# Patient Record
Sex: Female | Born: 1990 | Hispanic: Yes | Smoking: Never smoker
Health system: Southern US, Community
[De-identification: ages and names within clinical notes are randomized; demographics above are authoritative.]

## PROBLEM LIST (undated history)

## (undated) ENCOUNTER — Inpatient Hospital Stay (HOSPITAL_COMMUNITY): Payer: Self-pay

## (undated) DIAGNOSIS — Z789 Other specified health status: Secondary | ICD-10-CM

## (undated) DIAGNOSIS — L309 Dermatitis, unspecified: Secondary | ICD-10-CM

## (undated) HISTORY — DX: Dermatitis, unspecified: L30.9

## (undated) HISTORY — PX: NO PAST SURGERIES: SHX2092

## (undated) HISTORY — PX: LASIK: SHX215

---
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
## 2012-07-28 ENCOUNTER — Encounter (HOSPITAL_COMMUNITY): Payer: Self-pay | Admitting: *Deleted

## 2012-07-28 ENCOUNTER — Emergency Department (INDEPENDENT_AMBULATORY_CARE_PROVIDER_SITE_OTHER)
Admission: EM | Admit: 2012-07-28 | Discharge: 2012-07-28 | Disposition: A | Discharge status: Home / Self Care | Payer: Self-pay | Source: Home / Self Care | Attending: Family Medicine | Admitting: Family Medicine

## 2012-07-28 DIAGNOSIS — N390 Urinary tract infection, site not specified: Secondary | ICD-10-CM

## 2012-07-28 DIAGNOSIS — K5900 Constipation, unspecified: Secondary | ICD-10-CM

## 2012-07-28 DIAGNOSIS — R55 Syncope and collapse: Secondary | ICD-10-CM

## 2012-07-28 LAB — POCT URINALYSIS DIP (DEVICE)
Bilirubin Urine: NEGATIVE
Ketones, ur: NEGATIVE mg/dL
Protein, ur: 30 mg/dL — AB
Specific Gravity, Urine: 1.025 (ref 1.005–1.030)

## 2012-07-28 LAB — POCT I-STAT, CHEM 8
BUN: 13 mg/dL (ref 6–23)
Calcium, Ion: 1.33 mmol/L — ABNORMAL HIGH (ref 1.12–1.23)
Chloride: 105 mEq/L (ref 96–112)
Potassium: 4.2 mEq/L (ref 3.5–5.1)

## 2012-07-28 LAB — POCT PREGNANCY, URINE: Preg Test, Ur: NEGATIVE

## 2012-07-28 MED ORDER — POLYETHYLENE GLYCOL 3350 17 G PO PACK: 17.0000 g | PACK | Freq: Every day | ORAL | Status: DC

## 2012-07-28 MED ORDER — HYDROCORTISONE ACE-PRAMOXINE 1-1 % RE CREA: TOPICAL_CREAM | Freq: Two times a day (BID) | RECTAL | Status: DC

## 2012-07-28 MED ORDER — SULFAMETHOXAZOLE-TRIMETHOPRIM 800-160 MG PO TABS: 1.0000 | ORAL_TABLET | Freq: Two times a day (BID) | ORAL | Status: AC

## 2012-07-28 NOTE — ED Notes (Signed)
PT  REPORTS SHE  BECAME    DIZZY      THIS  AM  AND   BRIEFLY  PASSED  OUT    SHE  REPORTS  SHE  HAS  DONE THIS  IN  PAST  SHE REPORTS  SHE  HAD  SOME  SLIHGT  NAUSEA       -  SHE  REPORTS  SHE  FEELS  BETTER  NOW  AND   SHE    IS  AWAKE  ALERT  AND  ORIENTED  -  SHE  REPORTS  SHE       DID  HOWEVER  EAT THIS  AM

## 2012-07-29 NOTE — ED Provider Notes (Signed)
History     CSN: [id number]  Arrival date & time 07/28/12  1139   First MD Initiated Contact with Patient 07/28/12 1208      Chief Complaint  Patient presents with  . Dizziness    (Consider location/radiation/quality/duration/timing/severity/associated sxs/prior treatment) HPI Comments: 21 year old smoking female with no significant past medical history. Here complaining of episodes of dizziness this morning at work. Symptoms was associated with mild nausea but no vomiting. Symptoms were brief and self resolved. Patient denies headache or visual changes. No seizures, syncope, palpitations or periods where she loses consciousness. Patient reports intermittent dizzy episodes when she gets up abruptly from bed or chair in the past. Also patient has been told in the past she has low blood pressure. Reports urinary frequency and discomfort on urination during the last 2 days. Denies abdominal, pelvic pain or vaginal discharge.  Patient also reports constipation and rectal irritation after defecation. Last bowel movement 2 days ago hard stools. Patient reports he had breakfast but she barely drinks fluids.   History reviewed. No pertinent past medical history.  History reviewed. No pertinent past surgical history.  No family history on file.  History  Substance Use Topics  . Smoking status: Never Smoker   . Smokeless tobacco: Not on file  . Alcohol Use: No    OB History    Grav Para Term Preterm Abortions TAB SAB Ect Mult Living                  Review of Systems  Constitutional: Negative for fever, chills, diaphoresis, activity change, appetite change, fatigue and unexpected weight change.  HENT: Negative for ear pain, congestion, sore throat, neck pain, tinnitus and ear discharge.   Eyes: Negative for visual disturbance.  Respiratory: Negative for cough and shortness of breath.   Cardiovascular: Negative for chest pain, palpitations and leg swelling.  Gastrointestinal:  Positive for constipation and rectal pain. Negative for nausea, vomiting, abdominal pain and diarrhea.  Genitourinary: Positive for dysuria and frequency. Negative for urgency, hematuria, flank pain, vaginal bleeding, vaginal discharge, vaginal pain and pelvic pain.  Neurological: Positive for dizziness. Negative for tremors, seizures, syncope, weakness, numbness and headaches.    Allergies  Review of patient's allergies indicates no known allergies.  Home Medications   Current Outpatient Rx  Name  Route  Sig  Dispense  Refill  . POLYETHYLENE GLYCOL 3350 PO PACK   Oral   Take 17 g by mouth daily.   14 each   0   . HYDROCORTISONE ACE-PRAMOXINE 1-1 % RE CREA   Rectal   Place rectally 2 (two) times daily.   30 g   0   . SULFAMETHOXAZOLE-TRIMETHOPRIM 800-160 MG PO TABS   Oral   Take 1 tablet by mouth 2 (two) times daily.   6 tablet   0     BP 95/62  Pulse 64  Temp 97.9 F (36.6 C) (Oral)  Resp 20  SpO2 99%  Physical Exam  Nursing note and vitals reviewed. Constitutional: She is oriented to person, place, and time. She appears well-developed and well-nourished. No distress.  HENT:  Head: Normocephalic and atraumatic.  Right Ear: External ear normal.  Left Ear: External ear normal.  Nose: Nose normal.  Mouth/Throat: Oropharynx is clear and moist. No oropharyngeal exudate.  Eyes: Conjunctivae normal and EOM are normal. Pupils are equal, round, and reactive to light. Right eye exhibits no discharge. Left eye exhibits no discharge. No scleral icterus.  Neck: Normal range of motion. Neck  supple. No JVD present. No thyromegaly present.  Cardiovascular: Normal rate, regular rhythm, normal heart sounds and intact distal pulses.  Exam reveals no gallop and no friction rub.   No murmur heard. Pulmonary/Chest: Effort normal and breath sounds normal. No respiratory distress. She has no wheezes. She has no rales. She exhibits no tenderness.  Abdominal: Soft. Bowel sounds are  normal. She exhibits no distension and no mass. There is no tenderness. There is no rebound and no guarding.       No cvt  Lymphadenopathy:    She has no cervical adenopathy.  Neurological: She is alert and oriented to person, place, and time. She displays normal reflexes. No cranial nerve deficit. She exhibits normal muscle tone. Coordination normal.  Skin: Skin is warm. No rash noted. She is not diaphoretic. No pallor.  Psychiatric: She has a normal mood and affect. Her behavior is normal. Judgment and thought content normal.    ED Course  Procedures (including critical care time)  Labs Reviewed  POCT URINALYSIS DIP (DEVICE) - Abnormal; Notable for the following:    Protein, ur 30 (*)     Leukocytes, UA SMALL (*)  Biochemical Testing Only. Please order routine urinalysis from main lab if confirmatory testing is needed.   All other components within normal limits  POCT I-STAT, CHEM 8 - Abnormal; Notable for the following:    Glucose, Bld 101 (*)     Calcium, Ion 1.33 (*)     All other components within normal limits  POCT PREGNANCY, URINE  LAB REPORT - SCANNED   No results found.   1. UTI (lower urinary tract infection)   2. Vasovagal near syncope   3. Constipation       MDM  Normal physical examination including neurologic exam. Mild orthostatic heart rate changes. Appears patient normally has low blood pressure. Mild findings in urine suggestive of possible low uncomplicated UTI possibly adding to patient's symptoms. Negative pregnancy test. Decided to treat with Bactrim. Encouraged good hydration.  Constipation was treated with MiraLax and pramoxine/hydrocortisone rectal cream for rectal discomfort. Asked to establish care with a primary care provider to monitor her symptoms. Supportive care and red flags that should prompt her return to medical attention discussed with patient, mother and provided in writing.        Sharin Grave, MD 07/30/12 1116

## 2013-08-10 ENCOUNTER — Emergency Department (INDEPENDENT_AMBULATORY_CARE_PROVIDER_SITE_OTHER)
Admission: EM | Admit: 2013-08-10 | Discharge: 2013-08-10 | Disposition: A | Discharge status: Home / Self Care | Payer: Self-pay | Source: Home / Self Care | Attending: Family Medicine | Admitting: Family Medicine

## 2013-08-10 ENCOUNTER — Encounter (HOSPITAL_COMMUNITY): Payer: Self-pay | Admitting: Emergency Medicine

## 2013-08-10 ENCOUNTER — Emergency Department (INDEPENDENT_AMBULATORY_CARE_PROVIDER_SITE_OTHER): Payer: Self-pay

## 2013-08-10 DIAGNOSIS — J069 Acute upper respiratory infection, unspecified: Secondary | ICD-10-CM

## 2013-08-10 MED ORDER — BENZONATATE 100 MG PO CAPS: 100.0000 mg | ORAL_CAPSULE | Freq: Three times a day (TID) | ORAL | Status: DC | PRN

## 2013-08-10 NOTE — ED Notes (Signed)
Cough and sore throat for a week, cough particularly bad at night, no fever

## 2013-08-10 NOTE — ED Provider Notes (Signed)
CSN: [id number]     Arrival date & time 08/10/13  1003 History   First MD Initiated Contact with Patient 08/10/13 1029     Chief Complaint  Patient presents with  . Cough  . Sore Throat   (Consider location/radiation/quality/duration/timing/severity/associated sxs/prior Treatment) HPI Comments: Denies dyspnea, fever, or hemoptysis. No recent travel or ill contacts. Patient is non-smoker. Describes throat as feeling irritated from the cough.   Patient is a 22 y.o. female presenting with cough and pharyngitis. The history is provided by the patient.  Cough Cough characteristics:  Dry Severity:  Mild Onset quality:  Gradual Duration:  1 week Timing:  Intermittent Progression:  Waxing and waning Chronicity:  New Smoker: no   Context: not occupational exposure, not sick contacts and not smoke exposure   Ineffective treatments:  None tried Associated symptoms: sore throat   Sore Throat    History reviewed. No pertinent past medical history. Past Surgical History  Procedure Laterality Date  . Lasik     No family history on file. History  Substance Use Topics  . Smoking status: Never Smoker   . Smokeless tobacco: Not on file  . Alcohol Use: No   OB History   Grav Para Term Preterm Abortions TAB SAB Ect Mult Living                 Review of Systems  HENT: Positive for sore throat.   Respiratory: Positive for cough.   All other systems reviewed and are negative.    Allergies  Review of patient's allergies indicates no known allergies.  Home Medications   Current Outpatient Rx  Name  Route  Sig  Dispense  Refill  . polyethylene glycol (MIRALAX / GLYCOLAX) packet   Oral   Take 17 g by mouth daily.   14 each   0   . pramoxine-hydrocortisone (PROCTOCREAM-HC) 1-1 % rectal cream   Rectal   Place rectally 2 (two) times daily.   30 g   0    BP 116/65  Pulse 64  Temp(Src) 98.7 F (37.1 C) (Oral)  Resp 16  SpO2 99%  LMP 08/07/2013 Physical Exam  Nursing  note and vitals reviewed. Constitutional: She is oriented to person, place, and time. She appears well-developed and well-nourished. No distress.  HENT:  Head: Normocephalic and atraumatic.  Right Ear: Hearing, tympanic membrane, external ear and ear canal normal.  Left Ear: Hearing, tympanic membrane, external ear and ear canal normal.  Nose: Nose normal.  Mouth/Throat: Uvula is midline, oropharynx is clear and moist and mucous membranes are normal. No oral lesions. No trismus in the jaw. Normal dentition. No uvula swelling or dental caries.  Eyes: Conjunctivae are normal. Pupils are equal, round, and reactive to light. Right eye exhibits no discharge. Left eye exhibits no discharge. No scleral icterus.  Neck: Normal range of motion. Neck supple. No thyromegaly present.  Cardiovascular: Normal rate, regular rhythm and normal heart sounds.   Pulmonary/Chest: Effort normal and breath sounds normal.  Abdominal: Soft. Bowel sounds are normal. There is no tenderness.  Musculoskeletal: Normal range of motion.  Lymphadenopathy:    She has no cervical adenopathy.  Neurological: She is alert and oriented to person, place, and time.  Skin: Skin is warm and dry. No rash noted.  Psychiatric: She has a normal mood and affect. Her behavior is normal.    ED Course  Procedures (including critical care time) Labs Review Labs Reviewed - No data to display Imaging Review No results found.  EKG Interpretation    Date/Time:    Ventricular Rate:    PR Interval:    QRS Duration:   QT Interval:    QTC Calculation:   R Axis:     Text Interpretation:              MDM   Exam appears be benign. CXR unremarkable. Will make patient aware of results and advise symptomatic care at home and provide Rx for tessalon for cough suppression at home. Adive follow up with PCP if fever or no improvement in 7-10 days.    Jess Barters [place], [place] 08/10/13 [phone]

## 2013-08-11 NOTE — ED Provider Notes (Signed)
Medical screening examination/treatment/procedure(s) were performed by a resident physician or non-physician practitioner and as the supervising physician I was immediately available for consultation/collaboration.  Johnthan Axtman, MD    Walfred Bettendorf S Vennessa Affinito, MD 08/11/13 0807 

## 2015-07-15 ENCOUNTER — Encounter (HOSPITAL_COMMUNITY): Payer: Self-pay | Admitting: Emergency Medicine

## 2015-07-15 DIAGNOSIS — K279 Peptic ulcer, site unspecified, unspecified as acute or chronic, without hemorrhage or perforation: Secondary | ICD-10-CM | POA: Insufficient documentation

## 2015-07-15 DIAGNOSIS — Z3202 Encounter for pregnancy test, result negative: Secondary | ICD-10-CM | POA: Insufficient documentation

## 2015-07-15 LAB — URINALYSIS, ROUTINE W REFLEX MICROSCOPIC
BILIRUBIN URINE: NEGATIVE
Glucose, UA: NEGATIVE mg/dL
HGB URINE DIPSTICK: NEGATIVE
KETONES UR: NEGATIVE mg/dL
NITRITE: NEGATIVE
Protein, ur: NEGATIVE mg/dL
SPECIFIC GRAVITY, URINE: 1.026 (ref 1.005–1.030)
UROBILINOGEN UA: 0.2 mg/dL (ref 0.0–1.0)
pH: 6 (ref 5.0–8.0)

## 2015-07-15 LAB — URINE MICROSCOPIC-ADD ON

## 2015-07-15 LAB — POC URINE PREG, ED: PREG TEST UR: NEGATIVE

## 2015-07-15 NOTE — ED Notes (Signed)
Pt. reports intermittent upper abdominal pain and mid back pain onset last week , mild nausea with no emesis or diarrhea . No fever or chills.

## 2015-07-16 ENCOUNTER — Emergency Department (HOSPITAL_COMMUNITY)
Admission: EM | Admit: 2015-07-16 | Discharge: 2015-07-16 | Disposition: A | Discharge status: Home / Self Care | Payer: Self-pay | Attending: Emergency Medicine | Admitting: Emergency Medicine

## 2015-07-16 ENCOUNTER — Emergency Department (HOSPITAL_COMMUNITY): Payer: Self-pay

## 2015-07-16 DIAGNOSIS — K279 Peptic ulcer, site unspecified, unspecified as acute or chronic, without hemorrhage or perforation: Secondary | ICD-10-CM

## 2015-07-16 LAB — COMPREHENSIVE METABOLIC PANEL
ALT: 12 U/L — AB (ref 14–54)
AST: 20 U/L (ref 15–41)
Albumin: 3.8 g/dL (ref 3.5–5.0)
Alkaline Phosphatase: 47 U/L (ref 38–126)
Anion gap: 8 (ref 5–15)
BILIRUBIN TOTAL: 0.4 mg/dL (ref 0.3–1.2)
BUN: 8 mg/dL (ref 6–20)
CHLORIDE: 107 mmol/L (ref 101–111)
CO2: 25 mmol/L (ref 22–32)
CREATININE: 0.59 mg/dL (ref 0.44–1.00)
Calcium: 9.3 mg/dL (ref 8.9–10.3)
GFR calc Af Amer: >60 mL/min (ref >60)
GLUCOSE: 100 mg/dL — AB (ref 65–99)
Potassium: 3.8 mmol/L (ref 3.5–5.1)
SODIUM: 140 mmol/L (ref 135–145)
TOTAL PROTEIN: 6.9 g/dL (ref 6.5–8.1)

## 2015-07-16 LAB — CBC
HCT: 36.7 % (ref 36.0–46.0)
Hemoglobin: 12 g/dL (ref 12.0–15.0)
MCH: 29.4 pg (ref 26.0–34.0)
MCHC: 32.7 g/dL (ref 30.0–36.0)
MCV: 90 fL (ref 78.0–100.0)
PLATELETS: 214 10*3/uL (ref 150–400)
RBC: 4.08 MIL/uL (ref 3.87–5.11)
RDW: 13.1 % (ref 11.5–15.5)
WBC: 8 10*3/uL (ref 4.0–10.5)

## 2015-07-16 LAB — LIPASE, BLOOD: LIPASE: 88 U/L — AB (ref 11–51)

## 2015-07-16 MED ORDER — HYDROCODONE-ACETAMINOPHEN 5-325 MG PO TABS
1.0000 | ORAL_TABLET | ORAL | Status: DC | PRN
Start: 2015-07-16 — End: 2018-01-16

## 2015-07-16 MED ORDER — TRAMADOL HCL 50 MG PO TABS
50.0000 mg | ORAL_TABLET | Freq: Once | ORAL | Status: AC
  Administered 2015-07-16: 50 mg via ORAL
  Filled 2015-07-16: qty 1

## 2015-07-16 MED ORDER — SUCRALFATE 1 GM/10ML PO SUSP: 1.0000 g | Freq: Three times a day (TID) | ORAL | Status: DC

## 2015-07-16 MED ORDER — ONDANSETRON HCL 4 MG PO TABS: 4.0000 mg | ORAL_TABLET | Freq: Three times a day (TID) | ORAL | Status: DC | PRN

## 2015-07-16 MED ORDER — ONDANSETRON 4 MG PO TBDP: 4.0000 mg | ORAL_TABLET | Freq: Once | ORAL | Status: DC

## 2015-07-16 NOTE — Discharge Instructions (Signed)
lcera pptica  (Peptic Ulcer)  La lcera pptica es una llaga dolorosa en la membrana que recubre el esfago (lcera esofgica), el estmago (lcera gstrica), o la primera parte del intestino delgado (lcera duodenal). La lcera causa erosin en los tejidos profundos.  CAUSAS  Normalmente, el revestimiento del estmago y del intestino delgado se protegen a s mismos del cido con que se digieren los alimentos. El revestimiento protector puede daarse debido a:   Una infeccin causada por una bacteria llamada Helicobacter pylori (H. pylori).  El uso regular de medicamentos anti-inflamatorios no esteroides (AINE), como el ibuprofeno o la aspirina.  El consumo de tabaco. Otros factores de riesgo incluyen ser mayor de 50 aos, el consumo de alcohol en exceso y Wilburt Finlaytener antecedentes familiares de lcera.  SNTOMAS   Dolor quemante o punzante en la zona entre el pecho y el ombligo.  Acidez.  Nuseas y vmitos.  Hinchazn. El dolor empeora con el estmago vaco y por la noche. Si la lcera sangra, puede causar:   Materia fecal de color negro alquitranado.  Vmito de sangre roja brillante.  Vmito de aspecto similar a la borra del caf. DIAGNSTICO  El diagnstico se realiza basndose en la historia clnica y el examen fsico. Para encontrar las causas de la lcera podrn indicarle otros estudios y procedimientos. Encontrar la causa ayudar a Futures traderdeterminar el mejor tratamiento. Los estudios y procedimientos pueden incluir:   Anlisis de sangre, anlisis de materia fecal, o estudios del aliento para Engineer, manufacturingdetectar la bacteria H. pylori.  Una seriada del tracto gastrointestinal (GI) del esfago, el estmago y el intestino delgado.  Una endoscopia para examinar el esfago, el estmago y el intestino delgado.  Una biopsia. TRATAMIENTO  El tratamiento incluye:   La eliminacin de la causa de la lcera, como el tabaquismo, los PoynorAINE o el alcohol.  Medicamentos para reducir la cantidad de cido en  el tracto digestivo.  Antibiticos si la causa de la lcera es la bacteria H. pylori.  Una endoscopia superior para tratar Rolan Lipauna lcera sangrante.  Ciruga si el sangrado es grave o si la lcera ha perforado Event organiseralgn lugar en el sistema digestivo. INSTRUCCIONES PARA EL CUIDADO EN EL HOGAR   Evite el tabaco, el alcohol y la cafena. El fumar puede aumentar el cido en el estmago y el tabaquismo continuado no favorecer la curacin de las lceras.  Evite los alimentos y las bebidas que le parece que le causan molestias o que le agravan su lcera.  Tome slo la medicacin que le indic el profesional. No tome sustitutos de venta libre de los medicamentos recetados sin Science writerconsultar a su mdico.  Cumpla con las consultas de control y hgase los estudios segn las indicaciones. SOLICITE ATENCIN MDICA SI:   La infeccin no mejora dentro de los [address]  [address]das despus de Programmer, systemsiniciar el tratamiento.  Siente indigestin o [place]acidez de manera continua. SOLICITE ATENCIN MDICA DE INMEDIATO SI:   Siente un dolor repentino y agudo o persistente en el abdomen.  La materia fecal es sanguinolenta o negra, de aspecto alquitranado.  Vomita sangre o el vmito tiene el aspecto similar a la borra del caf.  Si se siente mareado, dbil o que va a desmayarse.  Se siente transpirado o sudoroso. ASEGRESE DE QUE:   Comprende estas instrucciones.  Controlar su enfermedad.  Solicitar ayuda de inmediato si no mejora o si empeora.   Esta informacin no tiene Theme park managercomo fin reemplazar el consejo del mdico. Asegrese de hacerle al mdico cualquier pregunta que tenga.  Document Released: 06/03/2005 Document Revised: 05/18/2012 Elsevier Interactive Patient Education 2016 [employer]. Food Choices for Gastroesophageal Reflux Disease, Adult When you have gastroesophageal reflux disease (GERD), the foods you eat and your eating habits are very important. Choosing the right foods can help ease the discomfort of GERD. WHAT  GENERAL GUIDELINES DO I NEED TO FOLLOW?  Choose fruits, vegetables, whole grains, low-fat dairy products, and low-fat meat, fish, and poultry.  Limit fats such as oils, salad dressings, butter, nuts, and avocado.  Keep a food diary to identify foods that cause symptoms.  Avoid foods that cause reflux. These may be different for different people.  Eat frequent small meals instead of three large meals each day.  Eat your meals slowly, in a relaxed setting.  Limit fried foods.  Cook foods using methods other than frying.  Avoid drinking alcohol.  Avoid drinking large amounts of liquids with your meals.  Avoid bending over or lying down until 2-3 hours after eating. WHAT FOODS ARE NOT RECOMMENDED? The following are some foods and drinks that may worsen your symptoms: Vegetables Tomatoes. Tomato juice. Tomato and spaghetti sauce. Chili peppers. Onion and garlic. Horseradish. Fruits Oranges, grapefruit, and lemon (fruit and juice). Meats High-fat meats, fish, and poultry. This includes hot dogs, ribs, ham, sausage, salami, and bacon. Dairy Whole milk and chocolate milk. Sour cream. Cream. Butter. Ice cream. Cream cheese.  Beverages Coffee and tea, with or without caffeine. Carbonated beverages or energy drinks. Condiments Hot sauce. Barbecue sauce.  Sweets/Desserts Chocolate and cocoa. Donuts. Peppermint and spearmint. Fats and Oils High-fat foods, including [place] fries and potato chips. Other Vinegar. Strong spices, such as black pepper, white pepper, red pepper, cayenne, curry powder, cloves, ginger, and chili powder. The items listed above may not be a complete list of foods and beverages to avoid. Contact your dietitian for more information.   This information is not intended to replace advice given to you by your health care provider. Make sure you discuss any questions you have with your health care provider.   Document Released: 08/24/2005 Document Revised: 09/14/2014  Document Reviewed: 06/28/2013 Elsevier Interactive Patient Education [employer]

## 2015-07-16 NOTE — ED Notes (Addendum)
Pt to ultrasound

## 2015-07-16 NOTE — ED Provider Notes (Signed)
CSN: [id number]     Arrival date & time 07/15/15  2155 History   First MD Initiated Contact with Patient 07/16/15 0126     Chief Complaint  Patient presents with  . Abdominal Pain     (Consider location/radiation/quality/duration/timing/severity/associated sxs/prior Treatment) HPI   PCP: No PCP Per Patient PMH: None  Michelle Bridges is a 24 y.o.  female  CHIEF COMPLAINT: abdominal pain  The patient presents to hte ER with her mother and father for evaluation of abdominal pain. It has been persisting for the past week but worsened when she woke up this morning. Her pain is epigastric, RUQ and wraps around towards her mid-back on the right side. She was seen at an Urgent Care and diagnosed with GERD, they wrote her a prescription and advised her to follow-up with PCP. The patient says the medication they wrote for her is only to be taken in the AM and she was still having pain. Her mom who had to have her gallbladder removed was concerned that no imaging or blood work was done and since patient continued to have pain brought her to the ER. She has had no N/V/D, denies any urine or vaginal complaints.  ROS: The patient denies diaphoresis, fever, headache, weakness (general or focal), confusion, change of vision,  dysphagia, aphagia, shortness of breath,  nausea, vomiting, diarrhea, lower extremity swelling, rash, neck pain, chest pain   History reviewed. No pertinent past medical history. Past Surgical History  Procedure Laterality Date  . Lasik     No family history on file. Social History  Substance Use Topics  . Smoking status: Never Smoker   . Smokeless tobacco: None  . Alcohol Use: No   OB History    No data available     Review of Systems  ROS: See HPI Constitutional: no fever  Eyes: no drainage  ENT: no runny nose  Cardiovascular: no chest pain  Resp: no SOB  GI: no vomiting GU: no dysuria Integumentary: no rash  Allergy: no hives   Musculoskeletal: no leg swelling  Neurological: no slurred speech ROS otherwise negative   Allergies  Review of patient's allergies indicates no known allergies.  Home Medications   Prior to Admission medications   Medication Sig Start Date End Date Taking? Authorizing Provider  benzonatate (TESSALON) 100 MG capsule Take 1 capsule (100 mg total) by mouth 3 (three) times daily as needed for cough (swallow whole). Patient not taking: Reported on 07/16/2015 08/10/13   Ria Clock, PA  HYDROcodone-acetaminophen (NORCO/VICODIN) 5-325 MG tablet Take 1-2 tablets by mouth every 4 (four) hours as needed. 07/16/15   Clydette Privitera Neva Seat, PA-C  ondansetron (ZOFRAN) 4 MG tablet Take 1 tablet (4 mg total) by mouth every 8 (eight) hours as needed for nausea or vomiting. 07/16/15   Roux Brandy Neva Seat, PA-C  polyethylene glycol (MIRALAX / GLYCOLAX) packet Take 17 g by mouth daily. Patient not taking: Reported on 07/16/2015 07/28/12   Adlih Moreno-Coll, MD  pramoxine-hydrocortisone (PROCTOCREAM-HC) 1-1 % rectal cream Place rectally 2 (two) times daily. Patient not taking: Reported on 07/16/2015 07/28/12   Adlih Moreno-Coll, MD  sucralfate (CARAFATE) 1 GM/10ML suspension Take 10 mLs (1 g total) by mouth 4 (four) times daily -  with meals and at bedtime. 07/16/15   Jobani Sabado Neva Seat, PA-C   BP 95/44 mmHg  Pulse 66  Temp(Src) 98.1 F (36.7 C) (Oral)  Resp 16  Ht 5' (1.524 m)  Wt 98 lb (44.453 kg)  BMI 19.14 kg/m2  SpO2  100%  LMP 06/30/2015 Physical Exam  Constitutional: She appears well-developed and well-nourished. No distress.  Well and comfortable appearing  HENT:  Head: Normocephalic and atraumatic.  Eyes: Pupils are equal, round, and reactive to light.  Neck: Normal range of motion. Neck supple.  Cardiovascular: Normal rate and regular rhythm.   Pulmonary/Chest: Effort normal.  Abdominal: Soft. Bowel sounds are normal. She exhibits no distension, no fluid wave and no ascites. There is no  hepatosplenomegaly. There is tenderness in the epigastric area. There is no rigidity, no rebound, no guarding and no CVA tenderness.  Neurological: She is alert.  Skin: Skin is warm and dry.  Nursing note and vitals reviewed.   ED Course  Procedures (including critical care time) Labs Review Labs Reviewed  LIPASE, BLOOD - Abnormal; Notable for the following:    Lipase 88 (*)    All other components within normal limits  COMPREHENSIVE METABOLIC PANEL - Abnormal; Notable for the following:    Glucose, Bld 100 (*)    ALT 12 (*)    All other components within normal limits  URINALYSIS, ROUTINE W REFLEX MICROSCOPIC ([facility]) - Abnormal; Notable for the following:    APPearance CLOUDY (*)    Leukocytes, UA SMALL (*)    All other components within normal limits  URINE MICROSCOPIC-ADD ON - Abnormal; Notable for the following:    Squamous Epithelial / LPF FEW (*)    Bacteria, UA MANY (*)    All other components within normal limits  URINE CULTURE  CBC  POC URINE PREG, ED    Imaging Review No results found. I have personally reviewed and evaluated these images and lab results as part of my medical decision-making.   EKG Interpretation None      MDM   Final diagnoses:  Peptic ulcer    Pt with epigastric abdominal pain, this is here second visit today. The Urgent Care diagnosed her with GERD/early peptic ulcer pain but her mother has a history of gall stones and wanted to be sure that she did not need surgery. Her pain is mild on exam. Her CMP, CBC and Lipase are also reassuring. Neg urine preg, her urinalysis appears to be contaminated and she is having no lower abdominal pain, N/V or urine symptoms, will send out urine culture.  US abd of RUQ is normal. Referral to GI given. Rx: Carafate and Protonix. Dietary recommendations given  Medications  traMADol (ULTRAM) tablet 50 mg (50 mg Oral Given 07/16/15 0425)    23 y.o.Michelle Bridges's medical screening exam was  performed and I feel the patient has had an appropriate workup for their chief complaint at this time and likelihood of emergent condition existing is low. They have been counseled on decision, discharge, follow up and which symptoms necessitate immediate return to the emergency department. They or their family verbally stated understanding and agreement with plan and discharged in stable condition.   Vital signs are stable at discharge. Filed Vitals:   07/16/15 0415  BP: 95/44  Pulse: [id number]       Raelin Pixler, PA-C 07/19/15 1330  Zadie Rhineonald Wickline, MD 07/19/15 1334

## 2015-07-17 LAB — URINE CULTURE

## 2017-08-03 ENCOUNTER — Ambulatory Visit: Payer: Self-pay

## 2017-08-03 NOTE — Telephone Encounter (Signed)
Pt is [redacted] weeks pregnant who called [place] and was triaged through the [facility] Engagement [facility]. Pt states she has been denied Medicaid and is waiting on the letter to show she was refused. Care advice given. [facility] For [facility]. Phone numbers and addresses given to pt. Pt stated understanding.  Reason for Disposition . [1] Sinus congestion (pressure, fullness) AND [2] present > 10 days  Answer Assessment - Initial Assessment Questions 1. LOCATION: "Where does it hurt?"      Sides of head and eyes and bridge of nose 2. ONSET: "When did the sinus pain start?"  (e.g., hours, days)      Thanksgiving 3. SEVERITY: "How bad is the pain?"   (Scale 1-10; mild, moderate or severe)   - MILD (1-3): doesn't interfere with normal activities    - MODERATE (4-7): interferes with normal activities (e.g., work or school) or awakens from sleep   - SEVERE (8-10): excruciating pain and patient unable to do any normal activities        8/10 takes a nap to get the headache to "calm down" 4. RECURRENT SYMPTOM: "Have you ever had sinus problems before?" If so, ask: "When was the last time?" and "What happened that time?"      no 5. NASAL CONGESTION: "Is the nose blocked?" If so, ask, "Can you open it or must you breathe through the mouth?"     At night cannot completely breath through her nose  6. NASAL DISCHARGE: "Do you have discharge from your nose?" If so ask, "What color?"     Clear but 2 days ago had blood in mucous 7. FEVER: "Do you have a fever?" If so, ask: "What is it, how was it measured, and when did it start?"      No fever 8. OTHER SYMPTOMS: "Do you have any other symptoms?" (e.g., sore throat, cough, earache, difficulty breathing)     slight sore throat, occasional non productive cough 9. PREGNANCY: "Is there any chance you are pregnant?" "When was your last menstrual period?"     [redacted] weeks pregnant  Protocols  used: SINUS PAIN OR CONGESTION-A-AH

## 2017-08-23 ENCOUNTER — Other Ambulatory Visit: Payer: Self-pay

## 2017-08-23 ENCOUNTER — Ambulatory Visit (INDEPENDENT_AMBULATORY_CARE_PROVIDER_SITE_OTHER): Payer: Medicaid Other | Admitting: Certified Nurse Midwife

## 2017-08-23 ENCOUNTER — Other Ambulatory Visit (HOSPITAL_COMMUNITY)
Admission: RE | Admit: 2017-08-23 | Discharge: 2017-08-23 | Disposition: A | Discharge status: Home / Self Care | Payer: Medicaid Other | Source: Ambulatory Visit | Attending: Certified Nurse Midwife | Admitting: Certified Nurse Midwife

## 2017-08-23 ENCOUNTER — Encounter: Payer: Self-pay | Admitting: Certified Nurse Midwife

## 2017-08-23 VITALS — BP 109/75 | HR 66 | Temp 98.2°F | Ht 60.0 in | Wt 103.9 lb

## 2017-08-23 DIAGNOSIS — Z3481 Encounter for supervision of other normal pregnancy, first trimester: Secondary | ICD-10-CM | POA: Diagnosis not present

## 2017-08-23 DIAGNOSIS — Z3400 Encounter for supervision of normal first pregnancy, unspecified trimester: Secondary | ICD-10-CM

## 2017-08-23 DIAGNOSIS — O98819 Other maternal infectious and parasitic diseases complicating pregnancy, unspecified trimester: Secondary | ICD-10-CM | POA: Insufficient documentation

## 2017-08-23 DIAGNOSIS — B373 Candidiasis of vulva and vagina: Secondary | ICD-10-CM | POA: Insufficient documentation

## 2017-08-23 DIAGNOSIS — Z3402 Encounter for supervision of normal first pregnancy, second trimester: Secondary | ICD-10-CM

## 2017-08-23 DIAGNOSIS — Z23 Encounter for immunization: Secondary | ICD-10-CM | POA: Diagnosis not present

## 2017-08-23 DIAGNOSIS — Z3A00 Weeks of gestation of pregnancy not specified: Secondary | ICD-10-CM | POA: Diagnosis not present

## 2017-08-23 NOTE — Progress Notes (Signed)
NOB. FLU Vaccine given in right deltoid, tolerated well.

## 2017-08-23 NOTE — Progress Notes (Signed)
Subjective:   Michelle BurlingtonDaniela Bridges is a 26 y.o. G1P0 at 2121w0d by LMP being seen today for her first obstetrical visit.  Her obstetrical history is significant for FOB is a large person per patient report, patient is very petite. Patient does intend to breast feed. Pregnancy history fully reviewed.  Patient reports nausea, no bleeding, no contractions, no cramping and no leaking.  HISTORY: Obstetric History   G1   P0   T0   P0   A0   L0    SAB0   TAB0   Ectopic0   Multiple0   Live Births0     # Outcome Date GA Lbr Len/2nd Weight Sex Delivery Anes PTL Lv  1 Current              History reviewed. No pertinent past medical history. Past Surgical History:  Procedure Laterality Date  . LASIK     Family History  Problem Relation Age of Onset  . Asthma Maternal Aunt   . Diabetes Maternal Aunt   . Diabetes Paternal Grandmother    Social History   Tobacco Use  . Smoking status: Never Smoker  . Smokeless tobacco: Never Used  Substance Use Topics  . Alcohol use: No  . Drug use: No   No Known Allergies Current Outpatient Medications on File Prior to Visit  Medication Sig Dispense Refill  . benzonatate (TESSALON) 100 MG capsule Take 1 capsule (100 mg total) by mouth 3 (three) times daily as needed for cough (swallow whole). (Patient not taking: Reported on 07/16/2015) 21 capsule 0  . HYDROcodone-acetaminophen (NORCO/VICODIN) 5-325 MG tablet Take 1-2 tablets by mouth every 4 (four) hours as needed. 10 tablet 0  . ondansetron (ZOFRAN) 4 MG tablet Take 1 tablet (4 mg total) by mouth every 8 (eight) hours as needed for nausea or vomiting. 20 tablet 0  . polyethylene glycol (MIRALAX / GLYCOLAX) packet Take 17 g by mouth daily. (Patient not taking: Reported on 07/16/2015) 14 each 0  . pramoxine-hydrocortisone (PROCTOCREAM-HC) 1-1 % rectal cream Place rectally 2 (two) times daily. (Patient not taking: Reported on 07/16/2015) 30 g 0  . sucralfate (CARAFATE) 1 GM/10ML suspension Take 10 mLs  (1 g total) by mouth 4 (four) times daily -  with meals and at bedtime. 420 mL 0   No current facility-administered medications on file prior to visit.      Exam   Vitals:   08/23/17 1418 08/23/17 1422  BP: 109/75   Pulse: 66   Temp: 98.2 F (36.8 C)   Weight: 103 lb 14.4 oz (47.1 kg)   Height:  5' (1.524 m)      Uterus:     Pelvic Exam: Perineum: no hemorrhoids, normal perineum   Vulva: normal external genitalia, no lesions   Vagina:  normal mucosa, normal discharge   Cervix: no lesions and normal, pap smear done.    Adnexa: normal adnexa and no mass, fullness, tenderness   Bony Pelvis: average  System: General: well-developed, well-nourished female in no acute distress   Breast:  normal appearance, no masses or tenderness   Skin: normal coloration and turgor, no rashes   Neurologic: oriented, normal, negative, normal mood   Extremities: normal strength, tone, and muscle mass, ROM of all joints is normal   HEENT PERRLA, extraocular movement intact and sclera clear, anicteric   Mouth/Teeth mucous membranes moist, pharynx normal without lesions and dental hygiene good   Neck supple and no masses   Cardiovascular: regular rate  and rhythm   Respiratory:  no respiratory distress, normal breath sounds   Abdomen: soft, non-tender; bowel sounds normal; no masses,  no organomegaly     Assessment:   Pregnancy: G1P0 Patient Active Problem List   Diagnosis Date Noted  . Supervision of normal first pregnancy, antepartum 08/23/2017     Plan:  1. Supervision of normal first pregnancy, antepartum     - Obstetric Panel, Including HIV - Hemoglobinopathy evaluation - Culture, OB Urine - Cervicovaginal ancillary only - Cytology - PAP - Vitamin D (25 hydroxy) - HgB A1c - MaterniT21 PLUS Core+SCA - Flu Vaccine QUAD 36+ mos IM (Fluarix, Quad PF) - Inheritest Core(CF97,SMA,FraX) - US MFM OB COMP + 14 WK; Future - AFP, Serum, Open Spina Bifida   Initial labs drawn. Continue  prenatal vitamins. Genetic Screening discussed, NIPS: ordered. Ultrasound discussed; fetal anatomic survey: ordered. Problem list reviewed and updated. The nature of [facility] with multiple MDs and other Advanced Practice Providers was explained to patient; also emphasized that residents, students are part of our team. Routine obstetric precautions reviewed. Return in about 4 weeks (around 09/20/2017) for ROB.     Orvilla Cornwallachelle Nilay Mangrum, CNM Center for [facility], [facility]

## 2017-08-24 LAB — CYTOLOGY - PAP: DIAGNOSIS: NEGATIVE

## 2017-08-24 LAB — CERVICOVAGINAL ANCILLARY ONLY
Bacterial vaginitis: NEGATIVE
Candida vaginitis: POSITIVE — AB
Chlamydia: NEGATIVE
Neisseria Gonorrhea: NEGATIVE
Trichomonas: NEGATIVE

## 2017-08-26 ENCOUNTER — Other Ambulatory Visit: Payer: Self-pay | Admitting: Certified Nurse Midwife

## 2017-08-26 DIAGNOSIS — E559 Vitamin D deficiency, unspecified: Secondary | ICD-10-CM

## 2017-08-26 DIAGNOSIS — Z3400 Encounter for supervision of normal first pregnancy, unspecified trimester: Secondary | ICD-10-CM

## 2017-08-26 DIAGNOSIS — B3731 Acute candidiasis of vulva and vagina: Secondary | ICD-10-CM

## 2017-08-26 DIAGNOSIS — B373 Candidiasis of vulva and vagina: Secondary | ICD-10-CM

## 2017-08-26 LAB — OBSTETRIC PANEL, INCLUDING HIV
ANTIBODY SCREEN: NEGATIVE
BASOS: 0 %
Basophils Absolute: 0 10*3/uL (ref 0.0–0.2)
EOS (ABSOLUTE): 0.1 10*3/uL (ref 0.0–0.4)
EOS: 1 %
HEMOGLOBIN: 13.3 g/dL (ref 11.1–15.9)
HEP B S AG: NEGATIVE
HIV Screen 4th Generation wRfx: NONREACTIVE
Hematocrit: 39.8 % (ref 34.0–46.6)
IMMATURE GRANS (ABS): 0 10*3/uL (ref 0.0–0.1)
IMMATURE GRANULOCYTES: 0 %
LYMPHS: 29 %
Lymphocytes Absolute: 3 10*3/uL (ref 0.7–3.1)
MCH: 30 pg (ref 26.6–33.0)
MCHC: 33.4 g/dL (ref 31.5–35.7)
MCV: 90 fL (ref 79–97)
MONOCYTES: 3 %
MONOS ABS: 0.3 10*3/uL (ref 0.1–0.9)
NEUTROS PCT: 67 %
Neutrophils Absolute: 6.9 10*3/uL (ref 1.4–7.0)
Platelets: 279 10*3/uL (ref 150–379)
RBC: 4.44 x10E6/uL (ref 3.77–5.28)
RDW: 14.3 % (ref 12.3–15.4)
RH TYPE: POSITIVE
RPR: NONREACTIVE
RUBELLA: 2.96 {index} (ref >0.99)
WBC: 10.3 10*3/uL (ref 3.4–10.8)

## 2017-08-26 LAB — HEMOGLOBINOPATHY EVALUATION
HEMOGLOBIN A2 QUANTITATION: 3 % (ref 1.8–3.2)
HGB C: 0 %
HGB S: 0 %
HGB VARIANT: 0 %
Hemoglobin F Quantitation: 0 % (ref 0.0–2.0)
Hgb A: 97 % (ref 96.4–98.8)

## 2017-08-26 LAB — AFP, SERUM, OPEN SPINA BIFIDA
AFP MoM: 1.54
AFP Value: 63.2 ng/mL
GEST. AGE ON COLLECTION DATE: 16 wk
Maternal Age At EDD: 24.4 yr
OSBR RISK 1 IN: 2457
Test Results:: NEGATIVE
WEIGHT: 104 [lb_av]

## 2017-08-26 LAB — VITAMIN D 25 HYDROXY (VIT D DEFICIENCY, FRACTURES): VIT D 25 HYDROXY: 21.4 ng/mL — AB (ref 30.0–100.0)

## 2017-08-26 LAB — HEMOGLOBIN A1C
Est. average glucose Bld gHb Est-mCnc: 100 mg/dL
Hgb A1c MFr Bld: 5.1 % (ref 4.8–5.6)

## 2017-08-26 MED ORDER — FLUCONAZOLE 150 MG PO TABS: 150.0000 mg | ORAL_TABLET | Freq: Once | ORAL | 0 refills | Status: AC

## 2017-08-26 MED ORDER — TERCONAZOLE 0.8 % VA CREA: 1.0000 | TOPICAL_CREAM | Freq: Every day | VAGINAL | 0 refills | Status: DC

## 2017-08-26 MED ORDER — VITAMIN D (ERGOCALCIFEROL) 1.25 MG (50000 UNIT) PO CAPS: 50000.0000 [IU] | ORAL_CAPSULE | ORAL | 2 refills | Status: DC

## 2017-08-28 LAB — MATERNIT21 PLUS CORE+SCA
CHROMOSOME 18: NEGATIVE
Chromosome 13: NEGATIVE
Chromosome 21: NEGATIVE
Y CHROMOSOME: DETECTED

## 2017-08-29 LAB — CULTURE, OB URINE

## 2017-08-29 LAB — URINE CULTURE, OB REFLEX

## 2017-09-03 LAB — INHERITEST CORE(CF97,SMA,FRAX)

## 2017-09-07 NOTE — L&D Delivery Note (Addendum)
Michelle Bridges is a 27 y.o. female G1P0 with IUP at [redacted]w[redacted]d admitted for IOL for cholestasis.  She progressed with foley bulb and pitocin augmentation to complete and pushed 30 minutes to deliver.  Cord clamping delayed by several minutes then clamped by CNM and cut by FOB.   Delivery Note At 7:04 PM a viable female was delivered via Vaginal, Spontaneous (Presentation: ROA).  APGAR: 8, 9; weight pending. Placenta status: Spontaneous, intact.  Cord: 3 vessels with the following complications: loose nuchal x1, delivered through and easily reduced after.  Anesthesia:  IV fentanyl Episiotomy: None Lacerations: 3rd degree, repaired by Dr. Earlene Plater Suture Repair: 2.0 vicryl Est. Blood Loss (mL):  100  Mom to postpartum.  Baby to Couplet care / Skin to Skin.  Rolm Bookbinder CNM 01/17/2018, 7:27 PM  Please schedule this patient for Postpartum visit in: 4 weeks with the following provider: Any provider For C/S patients schedule nurse incision check in weeks 2 weeks: no Low risk pregnancy complicated by: cholestasis Delivery mode:  SVD Anticipated Birth Control:  POPs PP Procedures needed: n/a  Schedule Integrated BH visit: no   I was called to room after delivery of infant and placenta. Patient noted to have 3A laceration of the perineum and small left vaginal wall laceration. I examined the rectum and no laceration was noted to extend through to the rectum. Patient was given 100 mcg fentanyl IV for pain relief. After injection of 1% lidocaine around the perineum, I repaired the 3rd degree (3a) laceration in the usual fashion with 2-0 Vicryl. I again digitally examined the rectum and there were no stitches noted in the rectum. I placed a figure of 8 suture with 2-0 monocryl at the left vaginal wall tear for some bleeding to good effect. A very small hemostatic periurethral laceration was noted and required no repair. One dose of Ancef 2 gm IV was given for prophylaxis. Patient tolerated well, to  go on scheduled colace x 6 weeks.   Baldemar Lenis, M.D. Attending Obstetrician & Gynecologist, [facility] for [employer], [facility]

## 2017-09-08 ENCOUNTER — Encounter (HOSPITAL_COMMUNITY): Payer: Self-pay | Admitting: Certified Nurse Midwife

## 2017-09-13 ENCOUNTER — Other Ambulatory Visit: Payer: Self-pay | Admitting: Certified Nurse Midwife

## 2017-09-13 ENCOUNTER — Ambulatory Visit (HOSPITAL_COMMUNITY)
Admission: RE | Admit: 2017-09-13 | Discharge: 2017-09-13 | Disposition: A | Discharge status: Home / Self Care | Payer: Medicaid Other | Source: Ambulatory Visit | Attending: Certified Nurse Midwife | Admitting: Certified Nurse Midwife

## 2017-09-13 DIAGNOSIS — Z363 Encounter for antenatal screening for malformations: Secondary | ICD-10-CM

## 2017-09-13 DIAGNOSIS — Z3A19 19 weeks gestation of pregnancy: Secondary | ICD-10-CM | POA: Insufficient documentation

## 2017-09-13 DIAGNOSIS — Z3400 Encounter for supervision of normal first pregnancy, unspecified trimester: Secondary | ICD-10-CM

## 2017-09-13 DIAGNOSIS — O322XX0 Maternal care for transverse and oblique lie, not applicable or unspecified: Secondary | ICD-10-CM | POA: Insufficient documentation

## 2017-09-13 DIAGNOSIS — O99820 Streptococcus B carrier state complicating pregnancy: Secondary | ICD-10-CM | POA: Insufficient documentation

## 2017-09-13 MED ORDER — NITROFURANTOIN MONOHYD MACRO 100 MG PO CAPS: 100.0000 mg | ORAL_CAPSULE | Freq: Two times a day (BID) | ORAL | 0 refills | Status: DC

## 2017-09-14 ENCOUNTER — Other Ambulatory Visit: Payer: Self-pay | Admitting: Certified Nurse Midwife

## 2017-09-14 DIAGNOSIS — Z3400 Encounter for supervision of normal first pregnancy, unspecified trimester: Secondary | ICD-10-CM

## 2017-09-20 ENCOUNTER — Encounter: Payer: Self-pay | Admitting: Certified Nurse Midwife

## 2017-09-20 ENCOUNTER — Ambulatory Visit (INDEPENDENT_AMBULATORY_CARE_PROVIDER_SITE_OTHER): Payer: Medicaid Other | Admitting: Certified Nurse Midwife

## 2017-09-20 VITALS — BP 103/68 | HR 76 | Wt 107.7 lb

## 2017-09-20 DIAGNOSIS — Z3402 Encounter for supervision of normal first pregnancy, second trimester: Secondary | ICD-10-CM

## 2017-09-20 DIAGNOSIS — Z3400 Encounter for supervision of normal first pregnancy, unspecified trimester: Secondary | ICD-10-CM

## 2017-09-20 DIAGNOSIS — O99820 Streptococcus B carrier state complicating pregnancy: Secondary | ICD-10-CM

## 2017-09-20 DIAGNOSIS — E559 Vitamin D deficiency, unspecified: Secondary | ICD-10-CM

## 2017-09-20 NOTE — Progress Notes (Signed)
   PRENATAL VISIT NOTE  Subjective:  Michelle Bridges is a 27 y.o. G1P0 at 7547w0d being seen today for ongoing prenatal care.  She is currently monitored for the following issues for this low-risk pregnancy and has Supervision of normal first pregnancy, antepartum; Vitamin D deficiency; and GBS (group B Streptococcus carrier), +RV culture, currently pregnant on their problem list.  Patient reports no complaints.  Contractions: Not present. Vag. Bleeding: None.  Movement: Present. Denies leaking of fluid.   The following portions of the patient's history were reviewed and updated as appropriate: allergies, current medications, past family history, past medical history, past social history, past surgical history and problem list. Problem list updated.  Objective:   Vitals:   09/20/17 0931  BP: 103/68  Pulse: 76  Weight: 107 lb 11.2 oz (48.9 kg)    Fetal Status: Fetal Heart Rate (bpm): 147; doppler Fundal Height: 20 cm Movement: Present     General:  Alert, oriented and cooperative. Patient is in no acute distress.  Skin: Skin is warm and dry. No rash noted.   Cardiovascular: Normal heart rate noted  Respiratory: Normal respiratory effort, no problems with respiration noted  Abdomen: Soft, gravid, appropriate for gestational age.  Pain/Pressure: Present     Pelvic: Cervical exam deferred        Extremities: Normal range of motion.  Edema: None  Mental Status:  Normal mood and affect. Normal behavior. Normal judgment and thought content.   Assessment and Plan:  Pregnancy: G1P0 at 2447w0d  1. Supervision of normal first pregnancy, antepartum     Doing well.    2. GBS (group B Streptococcus carrier), +RV culture, currently pregnant     PCN for labor/delivery  3. Vitamin D deficiency     Taking weekly vitamin D  Preterm labor symptoms and general obstetric precautions including but not limited to vaginal bleeding, contractions, leaking of fluid and fetal movement were reviewed  in detail with the patient. Please refer to After Visit Summary for other counseling recommendations.  Return in about 4 weeks (around 10/18/2017) for ROB.   Roe Coombsachelle A Dempsy Damiano, CNM

## 2017-09-20 NOTE — Progress Notes (Signed)
Pt c/o occasional lower abdominal pain

## 2017-09-20 NOTE — Patient Instructions (Addendum)
AREA PEDIATRIC/FAMILY PRACTICE PHYSICIANS  [facility] [address] [place], [place]  [address] Phone - [phone]   Fax - [phone]  [facility] [place] [facility] [address] [place], [place] [address] Phone - [phone]   Fax - [phone]  JACK AMOS [address] [place], [place]  [address] Phone - [phone]   Fax - [phone]  [facility] [address] [place], [place]  [address] Phone - [phone]   Fax - [phone]  [facility] [address] [place], [place]  [address] Phone - [phone]   Fax - [phone]  [facility] [address] [place], [place]  [address] Phone - [phone]   Fax - [phone]  [facility] [address] [place], [place]  [address] Phone - [phone]   Fax - [phone]  [facility] AT [facility] [address] [place] [place], [place]  [address] Phone - [phone]   Fax - [phone]  [facility] AT [facility] [address] [place], [place]  [address] Phone - [phone]   Fax - [phone] [facility] AT [place] [address] [place], [place]  [address] Phone - [phone]   Fax - [phone]  [facility] [address] [place], [place]  [address] Phone - [phone]   Fax - [phone]  [facility] AT [facility] [address] [place], [place]  [address] Phone - [phone]   Fax - [phone]  [facility] [address] [place], [place]  [address] Phone - [phone]   Fax - [phone]  [facility] [address] [place], [place]  [address] Phone - [phone]  [facility] [address] [place], [place]  [address] Phone - [phone]   Fax - [phone]  [facility] [address] [place], [place]  [address] Phone - [phone]   Fax - [phone]  [facility] [address] [place], [place]  [address] Phone - [phone]   Fax - [phone]  [facility] [address] [place], [place]  [address] Phone - [phone]   Fax - [phone]   [facility] [address] [place], [place]  [address] Phone - [phone]   Fax - [phone]  [facility] [address] [place], [place]  [address] Phone - [phone]   Fax - [phone]  DAVID RUBIN [address] [place], [place]  [address] Phone - [phone]   Fax - [phone]  [facility] [address] [place], [place]  [address] Phone - [phone]   Fax - [phone]  [facility] Alita Chyle [address] [place], [place]  [address] Phone - [phone]   Fax - [phone] Gerarda Fraction [address] [place], [place]  [address] Phone - [phone]   Fax - [phone]  [facility] [address] [place], [place]  [address] Phone - [phone]   Fax - [phone]  [facility] - [place] [address] [place], [place]  [address] Phone - [phone]   Fax - [phone]  [facility] [place] Wyvonne Lenz MD [address] [place] [address] Phone [phone]  Fax [phone]   Gwynneth Aliment trimestre de Psychiatrist (Second Trimester of Pregnancy) El segundo trimestre va desde la  semana13 hasta la 28, desde el cuarto hasta el sexto mes, y suele ser el momento en el que mejor se siente. En general, las nuseas matutinas han disminuido o han desaparecido completamente. Tendr ms energa y podr aumentarle el apetito. El beb por nacer (feto) se desarrolla rpidamente. Hacia el final del sexto mes, el beb mide aproximadamente 9 pulgadas (23 cm) y pesa alrededor de 1  libras (700 g). Es probable que sienta al beb moverse (dar pataditas) entre las 18 y 20 semanas del Psychiatrist. CUIDADOS EN EL HOGAR  No fume, no consuma hierbas ni beba alcohol. No tome frmacos que el mdico no haya autorizado.  No consuma ningn producto que contenga tabaco, lo que incluye cigarrillos, tabaco de Theatre manager o Administrator, Civil Service. Si necesita ayuda para dejar de fumar, consulte al [employer] Express. Puede recibir asesoramiento u otro tipo de apoyo para dejar de fumar.  Tome los medicamentos solamente como se lo haya indicado el mdico. Algunos medicamentos son seguros para tomar durante el Psychiatrist y otros no lo son.  Haga ejercicios solamente como se lo haya indicado el mdico. Interrumpa la actividad fsica si comienza a tener calambres.  Ingiera alimentos saludables de [place] regular.  Use un sostn que le brinde buen soporte si sus mamas estn sensibles.  No se d baos de inmersin en agua caliente, baos turcos ni saunas.  Colquese el cinturn de seguridad cuando conduzca.  No coma carne cruda ni queso sin cocinar; evite el contacto con las bandejas sanitarias de los gatos y la tierra que estos animales usan.  Tome las vitaminas prenatales.  Tome entre 1500 y 2000mg  de calcio diariamente comenzando en la semana20 del embarazo [place].  Pruebe tomar un medicamento que la ayude a defecar (un laxante suave) si el mdico lo autoriza. Consuma ms fibra, que se encuentra en las frutas y verduras frescas y los cereales integrales. Beba suficiente lquido para mantener el pis (orina) claro o de color amarillo plido.  Dese baos de asiento con agua tibia para Engineer, materials o las molestias causadas por las hemorroides. Use una crema para las hemorroides si el mdico la autoriza.  Si se le hinchan las venas (venas varicosas), use medias de descanso. Levante (eleve) los pies durante , 3 o 4veces por Futures trader. Limite el consumo de sal en su dieta.  No levante  objetos pesados, use zapatos de tacones bajos y sintese derecha.  Descanse con las piernas elevadas si tiene calambres o dolor de cintura.  Visite a su dentista si no lo ha Occupational hygienist. Use un cepillo de cerdas suaves para cepillarse los dientes. Psese el hilo dental con suavidad.  Puede seguir [employer], a menos que el mdico le indique lo contrario.  Concurra a los controles mdicos.  SOLICITE AYUDA SI:  Siente mareos.  Sufre calambres o presin leves en la parte baja del vientre (abdomen).  Sufre un dolor persistente en el abdomen.  Tiene Programme researcher, broadcasting/film/video (nuseas), vmitos, o tiene deposiciones acuosas (diarrea).  Advierte un olor ftido que proviene de la vagina.  Siente dolor al [employer]oco[employer]illips.  SOLICITE AYUDA DE INMEDIATO SI:  Tiene fiebre.  Tiene una prdida de lquido por la vagina.  Tiene sangrado o pequeas prdidas vaginales.  Siente dolor intenso o clicos en el abdomen.  Sube o baja de peso rpidamente.  Tiene dificultades para recuperar el aliento y siente dolor en el pecho.  Sbitamente se le hinchan mucho el rostro, las [place], los tobillos, los pies o las  piernas.  No ha sentido los movimientos del beb durante Georgianne Fickuna hora.  Siente un dolor de cabeza intenso que no se alivia con medicamentos.  Su visin se modifica.  Esta informacin no tiene Theme park managercomo fin reemplazar el consejo del mdico. Asegrese de hacerle al mdico cualquier pregunta que tenga. Document Released: 04/26/2013 Document Revised: 09/14/2014 Document Reviewed: 10/25/2012 Elsevier Interactive Patient Education  2017 [employer]vier Inc.  Informacin sobre parto y Passenger transport managertrabajo de parto prematuros Preterm Labor and Birth Information El embarazo tiene generalmente una duracin de 39 a 41 semanas. El [place] Fallstrabajo de parto es prematuro cuando se inicia muy pronto. Comienza antes de completar las 37 semanas de Regalembarazo. Cules son los factores de riesgo del Everesttrabajo de Encinalparto  prematuro? Existen mayores probabilidades de trabajo de parto prematuro en mujeres con las siguientes caractersticas:  Tuvieron una infeccin Academic librariandurante el embarazo.  El cuello uterino es corto.  Tuvieron trabajo de parto prematuro anteriormente.  Se sometieron a una ciruga en el cuello uterino.  Son menores de 17aos.  Tienen ms de 35aos.  Son afroamericanas.  Estn embarazadas de dos o ms bebs.  Consumen drogas mientras estn embarazadas.  Fuman mientras estn embarazadas.  No aumentan de peso lo suficiente durante el Big Lotsembarazo.  Se embarazaron inmediatamente despus de Nurse, mental healthotro embarazo.  Cules son los sntomas del Aleen Campitrabajo de Juliustownparto prematuro? Los sntomas del trabajo de parto prematuro incluyen lo siguiente:  Educational psychologistCalambres. Los calambres pueden parecerse a los que tiene una mujer durante el perodo menstrual. Los calambres pueden presentarse con diarrea.  Dolor de vientre (abdomen).  Dolor en la zona lumbar.  Tiene contracciones regulares o endurecimiento del tero. Siente como si el vientre se endurece.  Presin en la zona inferior del vientre que Advertising account executiveparece empeorar.  Pierde ms lquido (secrecin) por la vagina. El lquido puede ser acuoso o con [place]re.  Ruptura de la bolsa de aguas.  Por qu es importante notar los signos del Sanfordtrabajo de Bladensburgparto prematuro? Los bebs que nacen antes de tiempo pueden no estar completamente desarrollados. Estos pueden tener un riesgo mayor de padecer:  Problemas cardacos a Air cabin crewlargo plazo.  Problemas pulmonares a Air cabin crewlargo plazo.  Dificultades para controlar los sistemas corporales, por ejemplo, respirar.  Hemorragia cerebral.  Una afeccin que se denomina parlisis cerebral.  Dificultades en el aprendizaje.  Muerte.  Estos riesgos son The Procter & Gamblemucho mayores para bebs que nacen antes de las 34semanas de Mount Gileadembarazo. Cmo se trata Kathie Dikeel trabajo de parto prematuro? El tratamiento depende de lo siguiente:  El tiempo de [place] Austinembarazo.  Su estado de  [place]idd[place]ownsalud.  La salud del beb.  El tratamiento puede incluir lo siguiente:  Un punto (sutura) en el cuello uterino. Al parir, el cuello uterino se abre para que el beb pueda salir. El punto impide que el cuello uterino se abra antes de Duponttiempo.  Permanecer en el hospital.  Tomar medicamentos como, por ejemplo: ? Medicamentos hormonales. ? Medicamentos para TEFL teacherdetener las contracciones. ? Medicamentos para ayudar a la maduracin de los pulmones del beb. ? Medicamentos para evitar que el beb desarrolle parlisis cerebral.  Qu debo hacer si estoy en Ihor Dowtrabajo de parto prematuro? Si cree que est en trabajo de parto demasiado pronto, llame a su mdico de inmediato. Cmo puedo prevenir el trabajo de parto prematuro?  No use productos que contengan tabaco. ? Estos incluyen cigarrillos, tabaco para Theatre managermascar y Administrator, Civil Servicecigarrillos electrnicos. ? Si necesita ayuda para dejar de fumar, consulte al mdico.  No consuma drogas.  No tome ningn medicamento si el mdico no se lo  indic.  Consulte al mdico antes de empezar a tomar cualquier suplemento de hierbas.  Asegrese aumentar de peso como corresponde.  Tenga cuidado con las infecciones. Si cree que puede tener una infeccin, consulte al mdico para que la revisen inmediatamente.  Infrmele al mdico si ha tenido trabajo de parto prematuro anteriormente. Esta informacin no tiene Theme park manager el consejo del mdico. Asegrese de hacerle al mdico cualquier pregunta que tenga. Document Released: 09/26/2010 Document Revised: 12/02/2016 Document Reviewed: 01/15/2016 Elsevier Interactive Patient Education  2018 [employer].  [employer] del mtodo anticonceptivo Contraception Choices La anticoncepcin, o los mtodos anticonceptivos, hace referencia a los mtodos o dispositivos que evitan el [place]. Mtodos hormonales Implante anticonceptivo Un implante anticonceptivo consiste en un tubo plstico delgado que contiene una hormona. Se inserta en  la parte superior del brazo. Puede Consulting civil engineer por 3 aos. Inyecciones de progestina sola Las inyecciones de progestina sola contienen progestina, una forma sinttica de la hormona progesterona. Un mdico las administra cada 3 meses. Pldoras anticonceptivas Las pldoras anticonceptivas son pastillas que contienen hormonas que evitan el [place]. Deben tomarse una vez al da, preferentemente a la misma Economist. Parches anticonceptivos El parche anticonceptivo contiene hormonas que evitan el [place]. Se coloca en la piel, debe cambiarse una vez a la semana durante tres semanas y debe retirarse en la cuarta semana. Se necesita una receta para utilizar este mtodo anticonceptivo. Anillo vaginal Un anillo vaginal contiene hormonas que evitan el embarazo. Se coloca en la vagina durante tres semanas y se retira en la cuarta semana. Luego se repite el proceso con un anillo nuevo. Se necesita una receta para utilizar este mtodo anticonceptivo. Anticonceptivo de Associate Professor Los anticonceptivos de emergencia evitan el embarazo despus de Warehouse manager sexo sin proteccin. Vienen en forma de pldora y pueden tomarse hasta 5 das despus de [place]. Funcionan mejor cuando se toman lo ms pronto posible luego de eBay. La mayora de los anticonceptivos de emergencia estn disponibles sin receta mdica. Este mtodo no debe utilizarse como el nico mtodo anticonceptivo. Mtodos de barrera Preservativo masculino Un preservativo masculino es una vaina delgada que se coloca sobre el pene durante el sexo. Los preservativos evitan que el esperma ingrese en el cuerpo de la [place]. Pueden utilizarse con un espermicida para aumentar la efectividad. Deben desecharse luego de su uso. Preservativo femenino Un preservativo femenino es una vaina blanda y holgada que se coloca en la vagina antes de [place]. El preservativo evita que el esperma ingrese en el cuerpo de la [place]. Deben desecharse luego de  su uso. Diafragma Un diafragma es una barrera blanda con forma de cpula. Se inserta en la vagina antes del sexo, junto con un espermicida. El diafragma bloquea el ingreso de esperma en el tero, y el espermicida mata a los espermatozoides. El Designer, fashion/clothing en la vagina durante 6 a 8 horas despus de Warehouse manager sexo y debe retirarse en el plazo de las 24 horas. Un diafragma es recetado y colocado por un mdico. Debe reemplazarse cada 1 a 2 aos, despus de dar a luz, de aumentar ms de 15lb (6.8kg) y de [place] plvica. Capuchn cervical Un capuchn cervical es una copa redonda y blanda de ltex o plstico que se coloca en el cuello uterino. Se inserta en la vagina antes del sexo, junto con un espermicida. Bloquea el ingreso del esperma en el tero. El capuchn Radio producer durante 6 a 8 horas despus de Doctor, hospital  y debe retirarse en el plazo de las 48 horas. Un capuchn cervical debe ser recetado y colocado por un mdico. Debe reemplazarse cada 2aos. Esponja Una esponja es una pieza blanda y circular de espuma de poliuretano que contiene espermicida. La esponja ayuda a bloquear el ingreso de esperma en el tero, y el espermicida mata a los espermatozoides. Belva Bertin, debe humedecerla e insertarla en la vagina. Debe insertarse antes de eBay, debe permanecer dentro al menos durante 6 horas despus de tener sexo y debe retirarse y Nurse, adult en el plazo de las 30 horas. Espermicidas Los espermicidas son sustancias qumicas que matan o bloquean al esperma y no lo dejan ingresar al cuello uterino y al tero. Vienen en forma de crema, gel, supositorio, espuma o comprimido. Un espermicida debe insertarse en la vagina con un aplicador al menos 10 o 15 minutos antes de tener sexo para dar tiempo a que surta [place]. El proceso debe repetirse cada vez que tenga sexo. Los espermicidas no requieren Emergency planning/management officer. Anticonceptivos intrauterinos Dispositivo intrauterino  (DIU). Un DIU un dispositivo en forma de T que se coloca en el tero. Hay dos tipos:  DIU hormonal. Este tipo contiene progestina, una forma sinttica de la hormona progesterona. Este tipo puede permanecer colocado durante 3 a 5 aos.  DIU de cobre. Este tipo est recubierto con un alambre de cobre. Puede permanecer colocado durante 10 aos.  Mtodos anticonceptivos permanentes Ligadura de trompas en la mujer En este mtodo, se sellan, atan u obstruyen las trompas de [place] durante una ciruga para Automotive engineer que el vulo descienda [place]. Esterilizacin histeroscpica En este mtodo, se coloca un implante pequeo y flexible dentro de cada trompa de Falopio. Los implantes hacen que se forme un tejido cicatricial en las trompas de Falopio y que las obstruya para que el espermatozoide no pueda llegar al vulo. El procedimiento demora alrededor de 3 meses para que sea Tool. Debe utilizarse otro mtodo anticonceptivo durante esos 3 meses. Esterilizacin masculina Este es un procedimiento que consiste en atar los conductos que transportan el esperma (vasectoma). Luego del procedimiento, el hombre Manufacturing engineer lquido (semen). Mtodos de planificacin natural Planificacin familiar natural En este mtodo, la pareja no tiene [employer] Family Insurance la mujer podra quedar [place]. Mtodo calendario Marketing executive un seguimiento de la duracin de cada ciclo menstrual, identificar los [employer] que se puede producir un Psychiatrist y no Warehouse manager sexo durante esos [place]. Mtodo de la ovulacin En este mtodo, la pareja evita tener sexo durante la ovulacin. Mtodo sintotrmico Este mtodo implica no tener sexo durante la ovulacin. Normalmente, la mujer comprueba la ovulacin al observar cambios en su temperatura y en la consistencia del moco cervical. Mtodo posovulacin En este mtodo, la pareja espera a que finalice la ovulacin para Doctor, hospital. Resumen  La anticoncepcin, o  los mtodos anticonceptivos, hace referencia a los mtodos o dispositivos que evitan el [place].  Los mtodos anticonceptivos hormonales incluyen implantes, inyecciones, pastillas, parches, anillos vaginales y anticonceptivos de Associate Professor.  Los mtodos anticonceptivos de barrera pueden incluir preservativos masculinos, preservativos femeninos, diafragmas, capuchones cervicales, esponjas y espermicidas.  Hay dos tipos de DIU (dispositivos intrauterinos). Un DIU puede colocarse en el tero de una mujer para evitar el embarazo durante 3 a 5 aos.  La esterilizacin permanente puede realizarse mediante un procedimiento para hombres, mujeres o ambos.  Los [employer] de Medical sales representative natural incluyen no tener American Family Insurance la mujer podra quedar [place]. Esta  informacin no tiene Theme park manager el consejo del mdico. Asegrese de hacerle al mdico cualquier pregunta que tenga. Document Released: 08/24/2005 Document Revised: 12/14/2016 Document Reviewed: 12/14/2016 Elsevier Interactive Patient Education  2018 [employer]vin[employer].

## 2017-10-18 ENCOUNTER — Ambulatory Visit (INDEPENDENT_AMBULATORY_CARE_PROVIDER_SITE_OTHER): Payer: Medicaid Other | Admitting: Certified Nurse Midwife

## 2017-10-18 ENCOUNTER — Encounter: Payer: Self-pay | Admitting: Certified Nurse Midwife

## 2017-10-18 VITALS — BP 106/67 | HR 71 | Wt 112.0 lb

## 2017-10-18 DIAGNOSIS — Z3402 Encounter for supervision of normal first pregnancy, second trimester: Secondary | ICD-10-CM

## 2017-10-18 DIAGNOSIS — O99612 Diseases of the digestive system complicating pregnancy, second trimester: Secondary | ICD-10-CM

## 2017-10-18 DIAGNOSIS — Z3400 Encounter for supervision of normal first pregnancy, unspecified trimester: Secondary | ICD-10-CM

## 2017-10-18 DIAGNOSIS — E559 Vitamin D deficiency, unspecified: Secondary | ICD-10-CM

## 2017-10-18 DIAGNOSIS — K219 Gastro-esophageal reflux disease without esophagitis: Secondary | ICD-10-CM

## 2017-10-18 DIAGNOSIS — O99820 Streptococcus B carrier state complicating pregnancy: Secondary | ICD-10-CM

## 2017-10-18 MED ORDER — OMEPRAZOLE 20 MG PO CPDR: 20.0000 mg | DELAYED_RELEASE_CAPSULE | Freq: Two times a day (BID) | ORAL | 5 refills | Status: DC

## 2017-10-18 NOTE — Patient Instructions (Addendum)
AREA PEDIATRIC/FAMILY PRACTICE PHYSICIANS  [facility] [address], [address] [place], [place]  [address] Phone - [phone]   Fax - [phone]  [facility] [address] [place], [place] [address] Phone - [phone]   Fax - [phone]  JACK AMOS [address] [place], [place]  [address] Phone - [phone]   Fax - [phone]  [facility] [address] [place], [place]  [address] Phone - [phone]   Fax - [phone]  [facility] [address] [place], [place]  [address] Phone - [phone]   Fax - [phone]  [facility] [address], [address] [place], [place]  [address] Phone - [phone]   Fax - [phone]  [facility] [address] [place], [place]  [address] Phone - [phone]   Fax - [phone]  [facility] [address] [place], [address] [place], [place]  [address] Phone - [phone]   Fax - [phone]  [facility] [address] [place], [place]  [address] Phone - [phone]   Fax - [phone] [facility] [place] [address] [place], [place]  [address] Phone - [phone]   Fax - [phone]  [facility] [address] [place], [place]  [address] Phone - [phone]   Fax - [phone]  [facility] [address], [address] [place], [place]  [address] Phone - [phone]   Fax - [phone]  [facility] [address] [place], [place]  [address] Phone - [phone]   Fax - [phone]  [facility] [address] [place], [place]  [address] Phone - [phone]  [facility] [address] [place], [place]  [address] Phone - [phone]   Fax - [phone]  [facility] [address] [place], [place]  [address] Phone - [phone]   Fax - [phone]  [facility] [address] [place], [place]  [address] Phone - [phone]   Fax - [phone]  [facility] [address] [place], [place]  [address] Phone - [phone]   Fax - [phone]   [facility] [address] [place], [place]  [address] Phone - [phone]   Fax - [phone]  [facility] [address] [place], [place]  [address] Phone - [phone]   Fax - [phone]  DAVID RUBIN [address] [place], [place]  [address] Phone - [phone]   Fax - [phone]  [facility] [address] [place], [place]  [address] Phone - [phone]   Fax - [phone]  [facility] Alita Chyle [address] [place], [place]  [address] Phone - [phone]   Fax - [phone] Gerarda Fraction [address] [place], [place]  [address] Phone - [phone]   Fax - [phone]  [facility] [address] [place], [place]  [address] Phone - [phone]   Fax - [phone]  [facility] [place] [address] [place], [place]  [address] Phone - [phone]   Fax - [phone]  [facility] [place] Wyvonne Lenz MD [address] [place] [address] Phone [phone]  Fax [phone]  Prueba de tolerancia a la glucosa durante el embarazo Glucose Tolerance Test During Pregnancy La  prueba de tolerancia a la glucosa es un anlisis de sangre que se Bots[place]ausa para determinar si ha contrado un tipo de diabetes durante el embarazo (diabetes gestacional). Esto es cuando el cuerpo no procesa de forma Arboriculturistcorrecta el azcar (glucosa) en los alimentos que come, lo cual provoca niveles sanguneos altos de glucosa. Por lo general, la PTG se realiza despus de haberse hecho una prueba de glucosa  de 1 hora, cuyos resultados indican que posiblemente tiene diabetes gestacional. Tambin se puede hacer si:  Tiene antecedentes de haber parido bebs muy grandes o antecedentes de muerte fetal repetida (beb nacido muerto).  Tiene signos o sntomas de diabetes, tales como: ? Cambios en la visin. ? Hormigueo o adormecimiento en las manos o los pies. ? Cambios en el hambre, la sed y la miccin que no se explican por Firefighterel embarazo.  La PTG dura unas 3 horas. Le darn para beber una solucin de agua y azcar al principio de la prueba. Le extraern sangre antes de que beba la solucin, y 1, 2 y 3 horas despus de beberla. No se le permitir comer ni beber nada ms durante la prueba. Debe Geneticist, molecularpermanecer en el lugar en que se realiza la prueba para asegurarse de que la sangre se extraiga puntualmente. Tambin debe evitar realizar ejercicios durante la prueba porque esto puede AutoZonealterar los resultados. Cmo debo prepararme para esta prueba? Coma normalmente durante 3 das antes de la PTG e incluya muchos alimentos ricos en carbohidratos. No coma ni beba nada, excepto agua, durante las ltimas 12 horas antes de la prueba. Adems, el mdico puede pedirle que deje de tomar ciertos medicamentos antes de la prueba. Qu significan los resultados? Es su responsabilidad retirar el resultado del [place]townestudio. Consulte en el laboratorio o en el departamento en el que fue realizado el estudio cundo y cmo podr Starbucks Corporationobtener los resultados. Comunquese con el mdico si tiene Smith Internationalpreguntas sobre los resultados. Rango de Circuit Cityvalores normales Los rangos para los valores normales pueden variar entre diferentes laboratorios y hospitales. Siempre debe consultar a su mdico despus de realizarse un anlisis u otros estudios para saber si los valores de sus Aledoresultados se consideran dentro de los lmites normales. Los niveles normales de glucemia son los siguientes:  Ayuno: menos de 105mg /dl.  Una hora despus de beber la solucin: menos de  190mg /dl.  Dos horas despus de beber la solucin: menos de 165mg /dl.  Tres horas despus de beber la solucin: menos de 145mg /dl.  Algunas sustancias pueden AutoZonealterar los resultados de la PTG. Estos pueden incluir lo siguiente:  Medicamentos para la presin arterial y la insuficiencia cardaca, como betabloqueantes, furosemida y tiacidas.  Antiinflamatorios, como aspirina.  La nicotina.  Algunos medicamentos psiquitricos.  Significado de los Ball Corporationresultados que estn fuera de los rangos de los valores normales Los resultados de la PTG por debajo de los valores normales pueden indicar varios problemas de Pearl Beachsalud, tales como:  Diabetes gestacional.  Respuesta al estrs agudo.  Sndrome de [place].  Tumores como feocromocitoma o glucagonoma.  Problemas renales de larga duracin.  Pancreatitis.  Hipertiroidismo.  Infeccin actual.  Hable con el mdico Dole Foodsobre los resultados. El mdico Calpine Corporationutilizar los resultados para Education officer, environmentalrealizar un diagnstico y Chief Strategy Officerdeterminar un plan de tratamiento adecuado para usted. Esta informacin no tiene Theme park managercomo fin reemplazar el consejo del mdico. Asegrese de hacerle al mdico cualquier pregunta que tenga. Document Released: 02/23/2012 Document Revised: 11/12/2016 Document Reviewed: 12/29/2013 Elsevier Interactive Patient Education  2018 ArvinMeritorElsevier Inc.  Quantico BaseSegundo trimestre de Psychiatristembarazo Second Trimester of Pregnancy El  segundo trimestre va desde la semana14 hasta la 27, desde el cuarto hasta el sexto mes, y suele ser el momento en el que mejor se siente. Su organismo se ha adaptado a Charity fundraiser, y comienza a Diplomatic Services operational officer. En general, las nuseas matutinas han disminuido o han desaparecido completamente, puede tener ms energa y un aumento de apetito. El segundo trimestre es tambin la poca en la que el feto se desarrolla rpidamente. Hacia el final del sexto mes, el feto mide aproximadamente 9pulgadas (23cm) y pesa alrededor de 1 libras (700g). Es  probable que sienta que el beb se Teacher, English as a foreign language (da pataditas) entre las 16 y 20semanas del Psychiatrist. Cambios en el cuerpo durante el segundo trimestre Su cuerpo continua experimentando numerosos cambios durante su segundo trimestre. Estos cambios varan de [place] a [place].  Seguir American Standard Companies. Notar que la parte baja del abdomen sobresale.  Podrn aparecer las primeras Albertson's caderas, el abdomen y las [place].  Es posible que tenga dolores de cabeza que pueden aliviarse con ciertos medicamentos. Los medicamentos que tome deben estar aprobados por el mdico.  Tal vez tenga necesidad de orinar con ms frecuencia porque el feto est ejerciendo presin sobre la vejiga.  Debido al Vanetta Mulders podr sentir Anthoney Harada estomacal con frecuencia.  Puede estar estreida, ya que ciertas hormonas enlentecen los movimientos de los msculos que New York Life Insurance desechos a travs de los intestinos.  Pueden aparecer hemorroides o abultarse e hincharse las venas (venas varicosas).  Puede sentir dolor en la espalda. Esto se debe a: ? Aumento de peso. ? Las hormonas del Management consultant las articulaciones en la pelvis. ? Un cambio en el peso y los msculos que ayudan a Pharmacologist su equilibrio.  Sus pechos seguirn creciendo y se pondrn cada vez ms sensibles.  Las Veterinary surgeon y estar sensibles al cepillado y al hilo dental.  Pueden aparecer zonas oscuras o manchas (cloasma, mscara del [place]) en el rostro. Esto probablemente se atenuar despus del nacimiento del beb.  Es posible que se forme una lnea oscura desde el ombligo hasta la zona del pubis (linea nigra). Esto probablemente se atenuar despus del nacimiento del beb.  Tal vez haya cambios en el cabello. Esto cambios pueden incluir su engrosamiento, crecimiento rpido y Allied Waste Industries textura. Adems, a algunas mujeres se les cae el cabello durante o despus del embarazo, o tienen el cabello seco o fino. Lo ms probable es que el  cabello se le normalice despus del nacimiento del beb.  Qu debe esperar en las visitas prenatales Durante una visita prenatal de rutina:  La pesarn para asegurarse de que usted y el feto estn creciendo normalmente.  Le tomarn la presin arterial.  Le medirn el abdomen para controlar el desarrollo del beb.  Se escucharn los latidos cardacos fetales.  Se evaluarn los resultados de los estudios solicitados en visitas anteriores.  El mdico puede preguntarle lo siguiente:  Cmo se siente.  Si siente los movimientos del beb.  Si ha tenido sntomas anormales, como prdida de lquido, [place], dolores de cabeza intensos o clicos abdominales.  Si est consumiendo algn producto que contenga tabaco, como cigarrillos, tabaco de Theatre manager y Administrator, Civil Service.  Si tiene Colgate-Palmolive.  Otros estudios que podrn realizarse durante el segundo trimestre incluyen lo siguiente:  Anlisis de sangre para detectar lo siguiente: ? Concentraciones de hierro bajas (anemia). ? Nivel alto de azcar en la sangre que afecta a las mujeres embarazadas (diabetes gestacional) entre las semanas 24  y 35. ? Anticuerpos Rh. Esto es para detectar una protena en los glbulos rojos (factor Rh).  Anlisis de orina para detectar infecciones, diabetes o protenas en la orina.  Una ecografa para confirmar que el beb crece y se desarrolla correctamente.  Una amniocentesis para diagnosticar posibles problemas genticos.  Estudios del feto para descartar espina bfida y sndrome de [place].  Prueba del VIH (virus de inmunodeficiencia humana). Los exmenes prenatales de rutina incluyen la prueba de deteccin del VIH, a menos que decida no Futures trader.  Siga estas indicaciones en su casa: Medicamentos  Siga las indicaciones del mdico en relacin con el uso de medicamentos. Durante el embarazo, hay medicamentos que pueden tomarse y otros que no.  Tome vitaminas prenatales que contengan por lo  menos (?g) de cido flico.  Si est estreida, tome un laxante suave, si el mdico lo autoriza. Qu debe comer y beber  Meriel Flavors una dieta equilibrada que incluya gran cantidad de frutas y verduras frescas, cereales integrales, buenas fuentes de protenas como carnes [place], huevos o tofu, y lcteos descremados. El mdico la ayudar a Production assistant, radio cantidad de peso que puede [place].  No coma carne cruda ni quesos sin cocinar. Estos elementos contienen grmenes que pueden causar defectos congnitos en el beb.  Si no consume muchos alimentos con calcio, hable con su mdico sobre si debera tomar un suplemento diario de calcio.  Limite el consumo de alimentos con alto contenido de grasas y azcares procesados, como alimentos fritos o dulces.  Para evitar el estreimiento: ? Bebe suficiente lquido para mantener la orina clara o de color amarillo plido. ? Consuma alimentos ricos en fibra, como frutas y verduras frescas, cereales integrales y frijoles. Actividad  Haga ejercicio solamente como se lo haya indicado el mdico. La mayora de las mujeres pueden continuar su rutina de ejercicios durante el [place]. Intente realizar como mnimo de actividad fsica por lo menos 5das a la semana. Deje de hacer ejercicio si experimenta contracciones uterinas.  No levante objetos pesados, use zapatos de tacones bajos y [address].  Puede seguir Calpine Corporation, a menos que el mdico le indique lo contrario. Alivio del dolor y del Dentist  Use un sostn que le brinde buen soporte para prevenir las molestias causadas por la sensibilidad en los pechos.  Dese baos de asiento con agua tibia para Engineer, materials o las molestias causadas por las hemorroides. Use una crema para las hemorroides si el mdico la autoriza.  Descanse con las piernas elevadas si tiene calambres o dolor de cintura.  Si tiene venas varicosas, use medias de descanso.  Eleve los pies durante , 3 o 4veces por da. Limite el consumo de sal en su dieta. Cuidados prenatales  Escriba sus preguntas. Llvelas cuando concurra a las visitas prenatales.  Concurra a todas las visitas prenatales tal como se lo haya indicado el mdico. Esto es importante. Seguridad  Use el cinturn de seguridad en todo momento mientras conduce.  Haga una lista de los nmeros de telfono de Associate Professor, que [employer] nmeros de telfono de familiares, [place], el hospital y los departamentos de polica y bomberos. Instrucciones generales  Pdale al mdico que la derive a clases de educacin prenatal en su localidad. Debe comenzar a tomar las clases antes de que empiece el mes6 de [place].  Pida ayuda si tiene necesidades nutricionales o de asesoramiento Academic librarian. El mdico puede aconsejarla o derivarla a especialistas para que la ayuden con diferentes necesidades.  No se d baos de inmersin en agua caliente, baos turcos ni saunas.  No se haga duchas vaginales ni use tampones o toallas higinicas perfumadas.  No mantenga las piernas cruzadas durante [place].  Evite el contacto con las bandejas sanitarias de los gatos y la tierra que estos animales usan. Estos elementos contienen bacterias que pueden causar defectos congnitos al beb y la posible prdida del feto debido a un aborto espontneo o muerte fetal.  Evite fumar, consumir hierbas, beber alcohol y tomar frmacos que no le hayan recetado. Las sustancias qumicas que estos productos contienen pueden afectar la formacin y el desarrollo del beb.  No consuma ningn producto que contenga nicotina o tabaco, como cigarrillos y Administrator, Civil Service. Si necesita ayuda para dejar de fumar, consulte al American Express.  Visite a su dentista si an no lo ha Occupational hygienist. Use un cepillo de dientes blando para higienizarse los dientes y psese el hilo dental con suavidad. Comunquese con un mdico  si:  Tiene mareos.  Siente clicos leves, presin en la pelvis o dolor persistente en el abdomen.  Tiene nuseas, vmitos o diarrea persistentes.  Brett Fairy secrecin vaginal con mal olor.  Siente dolor al ConocoPhillips. Solicite ayuda de inmediato si:  Tiene fiebre.  Tiene una prdida de lquido por la vagina.  Tiene sangrado o pequeas prdidas vaginales.  Siente dolor intenso o clicos en el abdomen.  Sube de peso o baja de peso rpidamente.  Tiene dificultad para respirar y siente dolor de pecho.  Sbitamente se le hinchan mucho el rostro, las [place], los tobillos, los pies o las piernas.  No ha sentido los movimientos del beb durante Georgianne Fick.  Siente un dolor de cabeza intenso que no se alivia al tomar United Parcel.  Nota cambios en la visin. Resumen  El segundo trimestre va desde la semana14 hasta la 27, desde el cuarto hasta el sexto mes. Es tambin una poca en la que el feto se desarrolla rpidamente.  Su organismo atraviesa por muchos cambios durante el [place]. Estos cambios varan de [place] a [place].  Evite fumar, consumir hierbas, beber alcohol y tomar frmacos que no le hayan recetado. Estas sustancias qumicas afectan la formacin y el desarrollo de su beb.  No consuma ningn producto que contenga tabaco, lo que incluye cigarrillos, tabaco de Theatre manager y Administrator, Civil Service. Si necesita ayuda para dejar de fumar, consulte al mdico.  Comunquese con su mdico si tiene preguntas sobre esto. Concurra a todas las visitas prenatales tal como se lo haya indicado el mdico. Esto es importante. Esta informacin no tiene Theme park manager el consejo del mdico. Asegrese de hacerle al mdico cualquier pregunta que tenga. Document Released: 06/03/2005 Document Revised: 01/04/2017 Document Reviewed: 01/04/2017 Elsevier Interactive Patient Education  2018 ArvinMeritor.

## 2017-10-18 NOTE — Progress Notes (Signed)
   PRENATAL VISIT NOTE  Subjective:  Michelle Bridges is a 27 y.o. G1P0 at 1018w0d being seen today for ongoing prenatal care.  She is currently monitored for the following issues for this low-risk pregnancy and has Supervision of normal first pregnancy, antepartum; Vitamin D deficiency; and GBS (group B Streptococcus carrier), +RV culture, currently pregnant on their problem list.  Patient reports heartburn, no bleeding, no contractions, no cramping, no leaking and rash on hands that started after she was sick, resolving.  Contractions: Not present. Vag. Bleeding: None.  Movement: Present. Denies leaking of fluid.   The following portions of the patient's history were reviewed and updated as appropriate: allergies, current medications, past family history, past medical history, past social history, past surgical history and problem list. Problem list updated.  Objective:   Vitals:   10/18/17 1422  BP: 106/67  Pulse: 71  Weight: 112 lb (50.8 kg)    Fetal Status:     Movement: Present     General:  Alert, oriented and cooperative. Patient is in no acute distress.  Skin: Skin is warm and dry. No rash noted.   Cardiovascular: Normal heart rate noted  Respiratory: Normal respiratory effort, no problems with respiration noted  Abdomen: Soft, gravid, appropriate for gestational age.  Pain/Pressure: Absent     Pelvic: Cervical exam deferred        Extremities: Normal range of motion.  Edema: None  Mental Status:  Normal mood and affect. Normal behavior. Normal judgment and thought content.   Assessment and Plan:  Pregnancy: G1P0 at 7218w0d  1. Supervision of normal first pregnancy, antepartum     Doing well.  Prilosec ordered for heartburn.    2. GBS (group B Streptococcus carrier), +RV culture, currently pregnant     PCN for labor/delivery  3. Vitamin D deficiency     Taking weekly vitamin D.   Preterm labor symptoms and general obstetric precautions including but not limited to  vaginal bleeding, contractions, leaking of fluid and fetal movement were reviewed in detail with the patient. Please refer to After Visit Summary for other counseling recommendations.  Return in about 4 weeks (around 11/15/2017) for ROB, 2 hr OGTT.   Roe Coombsachelle A Johntay Doolen, CNM

## 2017-10-18 NOTE — Progress Notes (Signed)
ROB w/ complaint of small bumps on hands  Also upper adominal pain.

## 2017-10-22 ENCOUNTER — Ambulatory Visit (HOSPITAL_COMMUNITY): Payer: Medicaid Other

## 2017-10-22 ENCOUNTER — Other Ambulatory Visit: Payer: Self-pay | Admitting: Certified Nurse Midwife

## 2017-10-22 ENCOUNTER — Ambulatory Visit (HOSPITAL_COMMUNITY)
Admission: RE | Admit: 2017-10-22 | Discharge: 2017-10-22 | Disposition: A | Discharge status: Home / Self Care | Payer: Medicaid Other | Source: Ambulatory Visit | Attending: Certified Nurse Midwife | Admitting: Certified Nurse Midwife

## 2017-10-22 DIAGNOSIS — Z3A24 24 weeks gestation of pregnancy: Secondary | ICD-10-CM

## 2017-10-22 DIAGNOSIS — Z3402 Encounter for supervision of normal first pregnancy, second trimester: Secondary | ICD-10-CM | POA: Diagnosis not present

## 2017-10-22 DIAGNOSIS — Z362 Encounter for other antenatal screening follow-up: Secondary | ICD-10-CM | POA: Diagnosis present

## 2017-10-22 DIAGNOSIS — Z0489 Encounter for examination and observation for other specified reasons: Secondary | ICD-10-CM

## 2017-10-22 DIAGNOSIS — Z3400 Encounter for supervision of normal first pregnancy, unspecified trimester: Secondary | ICD-10-CM

## 2017-10-22 DIAGNOSIS — IMO0002 Reserved for concepts with insufficient information to code with codable children: Secondary | ICD-10-CM

## 2017-10-23 ENCOUNTER — Other Ambulatory Visit: Payer: Self-pay | Admitting: Certified Nurse Midwife

## 2017-10-23 DIAGNOSIS — Z3400 Encounter for supervision of normal first pregnancy, unspecified trimester: Secondary | ICD-10-CM

## 2017-11-15 ENCOUNTER — Ambulatory Visit (INDEPENDENT_AMBULATORY_CARE_PROVIDER_SITE_OTHER): Payer: Medicaid Other | Admitting: Certified Nurse Midwife

## 2017-11-15 ENCOUNTER — Other Ambulatory Visit: Payer: Medicaid Other

## 2017-11-15 ENCOUNTER — Encounter: Payer: Self-pay | Admitting: Certified Nurse Midwife

## 2017-11-15 VITALS — BP 103/72 | HR 73 | Wt 113.8 lb

## 2017-11-15 DIAGNOSIS — L308 Other specified dermatitis: Secondary | ICD-10-CM

## 2017-11-15 DIAGNOSIS — Z3400 Encounter for supervision of normal first pregnancy, unspecified trimester: Secondary | ICD-10-CM

## 2017-11-15 DIAGNOSIS — E559 Vitamin D deficiency, unspecified: Secondary | ICD-10-CM

## 2017-11-15 DIAGNOSIS — O99820 Streptococcus B carrier state complicating pregnancy: Secondary | ICD-10-CM

## 2017-11-15 MED ORDER — TRIAMCINOLONE ACETONIDE 0.5 % EX OINT: 1.0000 "application " | TOPICAL_OINTMENT | Freq: Two times a day (BID) | CUTANEOUS | 0 refills | Status: DC

## 2017-11-15 NOTE — Progress Notes (Signed)
Patient reports good fetal movement with some uterine irritability. 

## 2017-11-15 NOTE — Progress Notes (Signed)
   PRENATAL VISIT NOTE  Subjective:  Michelle Bridges is a 27 y.o. G1P0 at 27w0dbeing seen today for ongoing prenatal care.  She is currently monitored for the following issues for this low-risk pregnancy and has Supervision of normal first pregnancy, antepartum; Vitamin D deficiency; and GBS (group B Streptococcus carrier), +RV culture, currently pregnant on their problem list.  Patient reports no bleeding, no contractions, no cramping, no leaking and eczema on her hands.  Contractions: Irritability. Vag. Bleeding: None.  Movement: Present. Denies leaking of fluid.   The following portions of the patient's history were reviewed and updated as appropriate: allergies, current medications, past family history, past medical history, past social history, past surgical history and problem list. Problem list updated.  Objective:   Vitals:   11/15/17 0831  BP: 103/72  Pulse: 73  Weight: 113 lb 12.8 oz (51.6 kg)    Fetal Status: Fetal Heart Rate (bpm): 147; doppler Fundal Height: 28 cm Movement: Present  Presentation: Transverse  General:  Alert, oriented and cooperative. Patient is in no acute distress.  Skin: Skin is warm and dry. No rash noted.   Cardiovascular: Normal heart rate noted  Respiratory: Normal respiratory effort, no problems with respiration noted  Abdomen: Soft, gravid, appropriate for gestational age.  Pain/Pressure: Absent     Pelvic: Cervical exam deferred        Extremities: Normal range of motion.  Edema: None  Mental Status:  Normal mood and affect. Normal behavior. Normal judgment and thought content.   Assessment and Plan:  Pregnancy: G1P0 at 227w0d1. Supervision of normal first pregnancy, antepartum       - Glucose Tolerance, 2 Hours w/1 Hour - CBC - HIV antibody (with reflex) - RPR - Tdap vaccine greater than or equal to 7yo IM  2. Vitamin D deficiency     Taking weekly vitamin D  3. GBS (group B Streptococcus carrier), +RV culture, currently  pregnant     PCN for labor/delivery  4. Other eczema     R/O Cholestasis/PUPPS - triamcinolone ointment (KENALOG) 0.5 %; Apply 1 application topically 2 (two) times daily.  Dispense: 30 g; Refill: 0 - Comp Met (CMET) - Bile acids, total  Preterm labor symptoms and general obstetric precautions including but not limited to vaginal bleeding, contractions, leaking of fluid and fetal movement were reviewed in detail with the patient. Please refer to After Visit Summary for other counseling recommendations.  Return in about 2 weeks (around 11/29/2017) for ROB.   RaMorene CrockerCNM

## 2017-11-16 LAB — COMPREHENSIVE METABOLIC PANEL
A/G RATIO: 1.2 (ref 1.2–2.2)
ALK PHOS: 114 IU/L (ref 39–117)
ALT: 20 IU/L (ref 0–32)
AST: 26 IU/L (ref 0–40)
Albumin: 3.6 g/dL (ref 3.5–5.5)
BUN/Creatinine Ratio: 15 (ref 9–23)
BUN: 8 mg/dL (ref 6–20)
Bilirubin Total: <0.2 mg/dL (ref 0.0–1.2)
CALCIUM: 9.3 mg/dL (ref 8.7–10.2)
CO2: 19 mmol/L — AB (ref 20–29)
Chloride: 102 mmol/L (ref 96–106)
Creatinine, Ser: 0.53 mg/dL — ABNORMAL LOW (ref 0.57–1.00)
GFR calc Af Amer: 154 mL/min/{1.73_m2} (ref >59)
GFR calc non Af Amer: 133 mL/min/{1.73_m2} (ref >59)
GLOBULIN, TOTAL: 2.9 g/dL (ref 1.5–4.5)
Glucose: 73 mg/dL (ref 65–99)
POTASSIUM: 4.2 mmol/L (ref 3.5–5.2)
SODIUM: 141 mmol/L (ref 134–144)
Total Protein: 6.5 g/dL (ref 6.0–8.5)

## 2017-11-16 LAB — HIV ANTIBODY (ROUTINE TESTING W REFLEX): HIV SCREEN 4TH GENERATION: NONREACTIVE

## 2017-11-16 LAB — CBC
HEMOGLOBIN: 11.9 g/dL (ref 11.1–15.9)
Hematocrit: 35.8 % (ref 34.0–46.6)
MCH: 30.8 pg (ref 26.6–33.0)
MCHC: 33.2 g/dL (ref 31.5–35.7)
MCV: 93 fL (ref 79–97)
PLATELETS: 234 10*3/uL (ref 150–379)
RBC: 3.86 x10E6/uL (ref 3.77–5.28)
RDW: 14.3 % (ref 12.3–15.4)
WBC: 8.8 10*3/uL (ref 3.4–10.8)

## 2017-11-16 LAB — GLUCOSE TOLERANCE, 2 HOURS W/ 1HR
GLUCOSE, 1 HOUR: 135 mg/dL (ref 65–179)
GLUCOSE, 2 HOUR: 112 mg/dL (ref 65–152)
GLUCOSE, FASTING: 78 mg/dL (ref 65–91)

## 2017-11-16 LAB — BILE ACIDS, TOTAL: BILE ACIDS TOTAL: 13.9 umol/L — AB (ref 4.7–24.5)

## 2017-11-16 LAB — RPR: RPR: NONREACTIVE

## 2017-11-19 ENCOUNTER — Other Ambulatory Visit: Payer: Self-pay | Admitting: Certified Nurse Midwife

## 2017-11-19 DIAGNOSIS — O26613 Liver and biliary tract disorders in pregnancy, third trimester: Principal | ICD-10-CM

## 2017-11-19 DIAGNOSIS — K831 Obstruction of bile duct: Secondary | ICD-10-CM | POA: Insufficient documentation

## 2017-11-22 ENCOUNTER — Other Ambulatory Visit: Payer: Self-pay | Admitting: Certified Nurse Midwife

## 2017-11-22 DIAGNOSIS — Z3400 Encounter for supervision of normal first pregnancy, unspecified trimester: Secondary | ICD-10-CM

## 2017-11-29 ENCOUNTER — Encounter: Payer: Self-pay | Admitting: Obstetrics and Gynecology

## 2017-11-29 ENCOUNTER — Ambulatory Visit (INDEPENDENT_AMBULATORY_CARE_PROVIDER_SITE_OTHER): Payer: Medicaid Other | Admitting: Obstetrics and Gynecology

## 2017-11-29 VITALS — BP 100/68 | HR 73 | Wt 116.6 lb

## 2017-11-29 DIAGNOSIS — O26643 Intrahepatic cholestasis of pregnancy, third trimester: Secondary | ICD-10-CM

## 2017-11-29 DIAGNOSIS — K831 Obstruction of bile duct: Secondary | ICD-10-CM

## 2017-11-29 DIAGNOSIS — Z3400 Encounter for supervision of normal first pregnancy, unspecified trimester: Secondary | ICD-10-CM

## 2017-11-29 DIAGNOSIS — O99820 Streptococcus B carrier state complicating pregnancy: Secondary | ICD-10-CM

## 2017-11-29 DIAGNOSIS — O26613 Liver and biliary tract disorders in pregnancy, third trimester: Secondary | ICD-10-CM

## 2017-11-29 MED ORDER — URSODIOL 500 MG PO TABS
500.0000 mg | ORAL_TABLET | Freq: Two times a day (BID) | ORAL | 3 refills | Status: DC
Start: 2017-11-29 — End: 2018-08-13

## 2017-11-29 MED ORDER — HYDROXYZINE HCL 25 MG PO TABS: 25.0000 mg | ORAL_TABLET | Freq: Four times a day (QID) | ORAL | 2 refills | Status: DC | PRN

## 2017-11-29 NOTE — Progress Notes (Signed)
   PRENATAL VISIT NOTE  Subjective:  Michelle Bridges is a 27 y.o. G1P0 at 5667w0d being seen today for ongoing prenatal care.  She is currently monitored for the following issues for this high-risk pregnancy and has Supervision of normal first pregnancy, antepartum; Vitamin D deficiency; GBS (group B Streptococcus carrier), +RV culture, currently pregnant; and Cholestasis during pregnancy in third trimester on their problem list.  Patient reports continued pruritis.   . Vag. Bleeding: None.  Movement: Present. Denies leaking of fluid.   The following portions of the patient's history were reviewed and updated as appropriate: allergies, current medications, past family history, past medical history, past social history, past surgical history and problem list. Problem list updated.  Objective:   Vitals:   11/29/17 1024  BP: 100/68  Pulse: 73  Weight: 116 lb 9.6 oz (52.9 kg)    Fetal Status: Fetal Heart Rate (bpm): 144 Fundal Height: 30 cm Movement: Present     General:  Alert, oriented and cooperative. Patient is in no acute distress.  Skin: Skin is warm and dry. No rash noted.   Cardiovascular: Normal heart rate noted  Respiratory: Normal respiratory effort, no problems with respiration noted  Abdomen: Soft, gravid, appropriate for gestational age.  Pain/Pressure: Absent     Pelvic: Cervical exam deferred        Extremities: Normal range of motion.  Edema: None  Mental Status:  Normal mood and affect. Normal behavior. Normal judgment and thought content.   Assessment and Plan:  Pregnancy: G1P0 at 5967w0d  1. Supervision of normal first pregnancy, antepartum Patient is doing well  2. Cholestasis during pregnancy in third trimester Patient was unaware of diagnosis. Diagnosis explained - Rx actagall and vistaril provided - growth ultrasound and weekly BPP ordered - Antenatal testing starting at 32 weeks - Discussed plan for delivery by 37 weeks - US MFM OB FOLLOW UP;  Future - US MFM FETAL BPP WO NON STRESS; Future - US MFM FETAL BPP WO NON STRESS; Future  3. GBS (group B Streptococcus carrier), +RV culture, currently pregnant Will receive prophylaxis in labor  Preterm labor symptoms and general obstetric precautions including but not limited to vaginal bleeding, contractions, leaking of fluid and fetal movement were reviewed in detail with the patient. Please refer to After Visit Summary for other counseling recommendations.  Return in about 2 weeks (around 12/13/2017) for ROB.   Catalina AntiguaPeggy Thanya Cegielski, MD

## 2017-11-30 ENCOUNTER — Ambulatory Visit (HOSPITAL_COMMUNITY)
Admission: RE | Admit: 2017-11-30 | Discharge: 2017-11-30 | Disposition: A | Discharge status: Home / Self Care | Payer: Medicaid Other | Source: Ambulatory Visit | Attending: Obstetrics and Gynecology | Admitting: Obstetrics and Gynecology

## 2017-11-30 ENCOUNTER — Encounter (HOSPITAL_COMMUNITY): Payer: Self-pay

## 2017-11-30 DIAGNOSIS — O26613 Liver and biliary tract disorders in pregnancy, third trimester: Secondary | ICD-10-CM | POA: Insufficient documentation

## 2017-11-30 DIAGNOSIS — Z3A30 30 weeks gestation of pregnancy: Secondary | ICD-10-CM | POA: Insufficient documentation

## 2017-11-30 DIAGNOSIS — K831 Obstruction of bile duct: Secondary | ICD-10-CM

## 2017-11-30 NOTE — Procedures (Signed)
Michelle SkillernDaniela Bridges 1990-10-04 6521w1d  Fetus A Non-Stress Test Interpretation for 11/30/17  Indication: Unsatisfactory BPP  Fetal Heart Rate A Mode: External Baseline Rate (A): 135 bpm Variability: Moderate Accelerations: 10 x 10, 15 x 15 Decelerations: Variable Multiple birth?: No  Uterine Activity Mode: Palpation, Toco Contraction Frequency (min): 3-5 Contraction Duration (sec): 50-60 Contraction Quality: Mild(Pt denies feeling) Resting Tone Palpated: Relaxed Resting Time: Adequate  Interpretation (Fetal Testing) Nonstress Test Interpretation: Reactive Comments: Reviewed tracing with Dr. Marjo Bickerenney

## 2017-12-13 ENCOUNTER — Ambulatory Visit (INDEPENDENT_AMBULATORY_CARE_PROVIDER_SITE_OTHER): Payer: Medicaid Other | Admitting: Obstetrics and Gynecology

## 2017-12-13 ENCOUNTER — Other Ambulatory Visit: Payer: Self-pay | Admitting: Obstetrics and Gynecology

## 2017-12-13 ENCOUNTER — Encounter: Payer: Self-pay | Admitting: Obstetrics and Gynecology

## 2017-12-13 ENCOUNTER — Ambulatory Visit (HOSPITAL_COMMUNITY)
Admission: RE | Admit: 2017-12-13 | Discharge: 2017-12-13 | Disposition: A | Discharge status: Home / Self Care | Payer: Medicaid Other | Source: Ambulatory Visit | Attending: Obstetrics and Gynecology | Admitting: Obstetrics and Gynecology

## 2017-12-13 ENCOUNTER — Encounter (HOSPITAL_COMMUNITY): Payer: Self-pay

## 2017-12-13 VITALS — BP 102/69 | HR 71 | Temp 98.1°F | Wt 119.3 lb

## 2017-12-13 DIAGNOSIS — Z3A32 32 weeks gestation of pregnancy: Secondary | ICD-10-CM

## 2017-12-13 DIAGNOSIS — O26613 Liver and biliary tract disorders in pregnancy, third trimester: Secondary | ICD-10-CM | POA: Insufficient documentation

## 2017-12-13 DIAGNOSIS — K831 Obstruction of bile duct: Secondary | ICD-10-CM | POA: Insufficient documentation

## 2017-12-13 DIAGNOSIS — Z3400 Encounter for supervision of normal first pregnancy, unspecified trimester: Secondary | ICD-10-CM

## 2017-12-13 DIAGNOSIS — Z3403 Encounter for supervision of normal first pregnancy, third trimester: Secondary | ICD-10-CM

## 2017-12-13 DIAGNOSIS — O99820 Streptococcus B carrier state complicating pregnancy: Secondary | ICD-10-CM

## 2017-12-13 NOTE — Progress Notes (Signed)
   PRENATAL VISIT NOTE  Subjective:  Michelle Bridges is a 27 y.o. G1P0 at 7452w0d being seen today for ongoing prenatal care.  She is currently monitored for the following issues for this high-risk pregnancy and has Supervision of normal first pregnancy, antepartum; Vitamin D deficiency; GBS (group B Streptococcus carrier), +RV culture, currently pregnant; and Cholestasis during pregnancy in third trimester on their problem list.  Patient reports no complaints.  Contractions: Not present. Vag. Bleeding: None.  Movement: Present. Denies leaking of fluid.   The following portions of the patient's history were reviewed and updated as appropriate: allergies, current medications, past family history, past medical history, past social history, past surgical history and problem list. Problem list updated.  Objective:   Vitals:   12/13/17 1019  BP: 102/69  Pulse: 71  Temp: 98.1 F (36.7 C)  Weight: 119 lb 4.8 oz (54.1 kg)    Fetal Status: Fetal Heart Rate (bpm): NST   Movement: Present     General:  Alert, oriented and cooperative. Patient is in no acute distress.  Skin: Skin is warm and dry. No rash noted.   Cardiovascular: Normal heart rate noted  Respiratory: Normal respiratory effort, no problems with respiration noted  Abdomen: Soft, gravid, appropriate for gestational age.  Pain/Pressure: Present     Pelvic: Cervical exam deferred        Extremities: Normal range of motion.  Edema: None  Mental Status: Normal mood and affect. Normal behavior. Normal judgment and thought content.   Assessment and Plan:  Pregnancy: G1P0 at 2352w0d  1. Supervision of normal first pregnancy, antepartum Patient is doing well without complaints  2. Cholestasis during pregnancy in third trimester Patient was not able to obtain Actigall prescription as pharmacy states it was not covered by pregnancy medicaid- pharmacy contacted NST reviewed and non reactive with baseline 130, mod variability, + 10 x  10 accels, no decels Follow up BPP today Continue twice weekly NST  3. GBS (group B Streptococcus carrier), +RV culture, currently pregnant Will provide prophylaxis in labor  Preterm labor symptoms and general obstetric precautions including but not limited to vaginal bleeding, contractions, leaking of fluid and fetal movement were reviewed in detail with the patient. Please refer to After Visit Summary for other counseling recommendations.  Return in about 1 week (around 12/20/2017) for ROB, NST, AFI.  Future Appointments  Date Time Provider Department Center  12/13/2017 11:30 AM [facility] US 1 [facility]    Catalina AntiguaPeggy Dominika Losey, MD

## 2017-12-14 ENCOUNTER — Other Ambulatory Visit (HOSPITAL_COMMUNITY): Payer: Self-pay | Admitting: *Deleted

## 2017-12-14 ENCOUNTER — Telehealth: Payer: Self-pay

## 2017-12-14 DIAGNOSIS — O26619 Liver and biliary tract disorders in pregnancy, unspecified trimester: Principal | ICD-10-CM

## 2017-12-14 DIAGNOSIS — K831 Obstruction of bile duct: Secondary | ICD-10-CM

## 2017-12-14 NOTE — Telephone Encounter (Signed)
FYI I did a f/u call to pharmacy regarding pt Actigall Rx that she has not been able to start due to INS not covering per pharmacy.  Per [facility] today Rx is ready for pick up at no cost to the pt TC to pt to make her aware  Pt voiced understanding .

## 2017-12-20 ENCOUNTER — Ambulatory Visit (INDEPENDENT_AMBULATORY_CARE_PROVIDER_SITE_OTHER): Payer: Medicaid Other | Admitting: Obstetrics and Gynecology

## 2017-12-20 ENCOUNTER — Other Ambulatory Visit: Payer: Medicaid Other

## 2017-12-20 ENCOUNTER — Encounter: Payer: Self-pay | Admitting: Obstetrics and Gynecology

## 2017-12-20 VITALS — BP 105/70 | HR 76 | Wt 120.0 lb

## 2017-12-20 DIAGNOSIS — O26613 Liver and biliary tract disorders in pregnancy, third trimester: Secondary | ICD-10-CM | POA: Diagnosis not present

## 2017-12-20 DIAGNOSIS — K831 Obstruction of bile duct: Secondary | ICD-10-CM | POA: Diagnosis not present

## 2017-12-20 DIAGNOSIS — O99820 Streptococcus B carrier state complicating pregnancy: Secondary | ICD-10-CM

## 2017-12-20 DIAGNOSIS — Z3400 Encounter for supervision of normal first pregnancy, unspecified trimester: Secondary | ICD-10-CM

## 2017-12-20 DIAGNOSIS — Z3403 Encounter for supervision of normal first pregnancy, third trimester: Secondary | ICD-10-CM

## 2017-12-20 NOTE — Progress Notes (Signed)
   PRENATAL VISIT NOTE  Subjective:  Michelle Bridges is a 27 y.o. G1P0 at 4664w0d being seen today for ongoing prenatal care.  She is currently monitored for the following issues for this high-risk pregnancy and has Supervision of normal first pregnancy, antepartum; Vitamin D deficiency; GBS (group B Streptococcus carrier), +RV culture, currently pregnant; and Cholestasis during pregnancy in third trimester on their problem list.  Patient reports no complaints.  Contractions: Irritability. Vag. Bleeding: None.  Movement: Present. Denies leaking of fluid.   The following portions of the patient's history were reviewed and updated as appropriate: allergies, current medications, past family history, past medical history, past social history, past surgical history and problem list. Problem list updated.  Objective:   Vitals:   12/20/17 0940  BP: 105/70  Pulse: 76  Weight: 120 lb (54.4 kg)    Fetal Status: Fetal Heart Rate (bpm): NST   Movement: Present     General:  Alert, oriented and cooperative. Patient is in no acute distress.  Skin: Skin is warm and dry. No rash noted.   Cardiovascular: Normal heart rate noted  Respiratory: Normal respiratory effort, no problems with respiration noted  Abdomen: Soft, gravid, appropriate for gestational age.  Pain/Pressure: Present     Pelvic: Cervical exam deferred        Extremities: Normal range of motion.  Edema: Trace  Mental Status: Normal mood and affect. Normal behavior. Normal judgment and thought content.   Assessment and Plan:  Pregnancy: G1P0 at 3064w0d  1. Supervision of normal first pregnancy, antepartum Patient is doing well without complaints  2. Cholestasis during pregnancy in third trimester Continue antenatal testing Follow up growth on 4/18 with BPP NST reviewed and reactive with baseline 140, mod variability, +accels, no decels  - US MFM OB FOLLOW UP; Future - Fetal nonstress test  3. GBS (group B Streptococcus  carrier), +RV culture, currently pregnant Will provide prophylaxis in labor  Preterm labor symptoms and general obstetric precautions including but not limited to vaginal bleeding, contractions, leaking of fluid and fetal movement were reviewed in detail with the patient. Please refer to After Visit Summary for other counseling recommendations.  Return in about 1 week (around 12/27/2017) for ROB, NST.  Future Appointments  Date Time Provider Department Center  12/20/2017 10:30 AM [facility] None  12/20/2017 11:15 AM Tilla Wilborn, Gigi GinPeggy, MD [facility] None  12/23/2017  2:45 PM [facility] US 2 [facility]    Catalina AntiguaPeggy Onna Nodal, MD

## 2017-12-21 ENCOUNTER — Ambulatory Visit (HOSPITAL_COMMUNITY): Payer: Medicaid Other

## 2017-12-23 ENCOUNTER — Other Ambulatory Visit (HOSPITAL_COMMUNITY): Payer: Self-pay | Admitting: Maternal and Fetal Medicine

## 2017-12-23 ENCOUNTER — Encounter (HOSPITAL_COMMUNITY): Payer: Self-pay

## 2017-12-23 ENCOUNTER — Other Ambulatory Visit: Payer: Medicaid Other

## 2017-12-23 ENCOUNTER — Ambulatory Visit (HOSPITAL_COMMUNITY)
Admission: RE | Admit: 2017-12-23 | Discharge: 2017-12-23 | Disposition: A | Discharge status: Home / Self Care | Payer: Medicaid Other | Source: Ambulatory Visit | Attending: Maternal and Fetal Medicine | Admitting: Maternal and Fetal Medicine

## 2017-12-23 ENCOUNTER — Other Ambulatory Visit: Payer: Self-pay | Admitting: Obstetrics and Gynecology

## 2017-12-23 DIAGNOSIS — K831 Obstruction of bile duct: Secondary | ICD-10-CM | POA: Insufficient documentation

## 2017-12-23 DIAGNOSIS — Z3A33 33 weeks gestation of pregnancy: Secondary | ICD-10-CM

## 2017-12-23 DIAGNOSIS — O26613 Liver and biliary tract disorders in pregnancy, third trimester: Secondary | ICD-10-CM | POA: Insufficient documentation

## 2017-12-23 DIAGNOSIS — Z362 Encounter for other antenatal screening follow-up: Secondary | ICD-10-CM

## 2017-12-23 DIAGNOSIS — O26619 Liver and biliary tract disorders in pregnancy, unspecified trimester: Secondary | ICD-10-CM

## 2017-12-27 ENCOUNTER — Ambulatory Visit (INDEPENDENT_AMBULATORY_CARE_PROVIDER_SITE_OTHER): Payer: Medicaid Other | Admitting: Obstetrics and Gynecology

## 2017-12-27 ENCOUNTER — Encounter: Payer: Self-pay | Admitting: Obstetrics and Gynecology

## 2017-12-27 ENCOUNTER — Other Ambulatory Visit: Payer: Medicaid Other

## 2017-12-27 VITALS — BP 102/70 | HR 76 | Wt 120.0 lb

## 2017-12-27 DIAGNOSIS — K831 Obstruction of bile duct: Secondary | ICD-10-CM

## 2017-12-27 DIAGNOSIS — O26613 Liver and biliary tract disorders in pregnancy, third trimester: Secondary | ICD-10-CM

## 2017-12-27 DIAGNOSIS — O99820 Streptococcus B carrier state complicating pregnancy: Secondary | ICD-10-CM

## 2017-12-27 DIAGNOSIS — Z3400 Encounter for supervision of normal first pregnancy, unspecified trimester: Secondary | ICD-10-CM

## 2017-12-27 NOTE — Addendum Note (Signed)
Addended by: Hamilton CapriBURCH, ARIEL J on: 12/27/2017 08:54 AM   Modules accepted: Orders

## 2017-12-27 NOTE — Progress Notes (Signed)
   PRENATAL VISIT NOTE  Subjective:  Michelle Bridges is a 27 y.o. G1P0 at 11072w0d being seen today for ongoing prenatal care.  She is currently monitored for the following issues for this high-risk pregnancy and has Supervision of normal first pregnancy, antepartum; Vitamin D deficiency; GBS (group B Streptococcus carrier), +RV culture, currently pregnant; and Cholestasis during pregnancy in third trimester on their problem list.  Patient reports no complaints.  Contractions: Irritability. Vag. Bleeding: None.  Movement: Present. Denies leaking of fluid.   The following portions of the patient's history were reviewed and updated as appropriate: allergies, current medications, past family history, past medical history, past social history, past surgical history and problem list. Problem list updated.  Objective:   Vitals:   12/27/17 0820  BP: 102/70  Pulse: 76  Weight: 120 lb (54.4 kg)    Fetal Status: Fetal Heart Rate (bpm): NST   Movement: Present     General:  Alert, oriented and cooperative. Patient is in no acute distress.  Skin: Skin is warm and dry. No rash noted.   Cardiovascular: Normal heart rate noted  Respiratory: Normal respiratory effort, no problems with respiration noted  Abdomen: Soft, gravid, appropriate for gestational age.  Pain/Pressure: Present     Pelvic: Cervical exam deferred        Extremities: Normal range of motion.  Edema: Trace  Mental Status: Normal mood and affect. Normal behavior. Normal judgment and thought content.   Assessment and Plan:  Pregnancy: G1P0 at 7572w0d  1. Cholestasis during pregnancy in third trimester Patient is doing well without complaints Continue Actigall NST reviewed and reactive with baseline 130, mod variability, no accels, no decels Continue antenatal testing Will schedule IOL at 37 weeks - Fetal nonstress test  2. Supervision of normal first pregnancy, antepartum Patient is doing well Cultures next visit  3. GBS  (group B Streptococcus carrier), +RV culture, currently pregnant Will provide prophylaxis in labor  Preterm labor symptoms and general obstetric precautions including but not limited to vaginal bleeding, contractions, leaking of fluid and fetal movement were reviewed in detail with the patient. Please refer to After Visit Summary for other counseling recommendations.  No follow-ups on file.  Future Appointments  Date Time Provider Department Center  12/27/2017  9:00 AM Jamyrah Saur, Gigi GinPeggy, MD [facility] None  12/30/2017  8:15 AM [facility] None  01/03/2018  9:00 AM [facility] None  01/03/2018  9:45 AM Anthony Tamburo, MD [facility] None  01/06/2018  8:15 AM [facility] None  01/10/2018  8:15 AM [facility] None  01/10/2018  9:15 AM Jada Kuhnert, Gigi GinPeggy, MD [facility] None  01/13/2018  8:15 AM [facility] None    Catalina AntiguaPeggy Jaishaun Mcnab, MD

## 2017-12-27 NOTE — Progress Notes (Signed)
NST DX Cholestasis 

## 2017-12-30 ENCOUNTER — Ambulatory Visit (INDEPENDENT_AMBULATORY_CARE_PROVIDER_SITE_OTHER): Payer: Medicaid Other

## 2017-12-30 VITALS — BP 101/67 | HR 74 | Wt 121.7 lb

## 2017-12-30 DIAGNOSIS — O26613 Liver and biliary tract disorders in pregnancy, third trimester: Secondary | ICD-10-CM | POA: Diagnosis not present

## 2017-12-30 DIAGNOSIS — K831 Obstruction of bile duct: Secondary | ICD-10-CM

## 2017-12-30 NOTE — Progress Notes (Signed)
I have reviewed this chart and agree with the RN/CMA assessment and management.   NST reactive.  Baldemar LenisK. Meryl Davis, M.D. Attending Obstetrician & Gynecologist, [facility], [facility]

## 2017-12-30 NOTE — Progress Notes (Signed)
NST only dx: Cholestasis. NST - Reactive

## 2018-01-03 ENCOUNTER — Telehealth (HOSPITAL_COMMUNITY): Payer: Self-pay | Admitting: *Deleted

## 2018-01-03 ENCOUNTER — Encounter: Payer: Self-pay | Admitting: Obstetrics and Gynecology

## 2018-01-03 ENCOUNTER — Other Ambulatory Visit: Payer: Medicaid Other

## 2018-01-03 ENCOUNTER — Ambulatory Visit (INDEPENDENT_AMBULATORY_CARE_PROVIDER_SITE_OTHER): Payer: Medicaid Other | Admitting: Obstetrics and Gynecology

## 2018-01-03 VITALS — BP 104/71 | HR 71 | Wt 120.8 lb

## 2018-01-03 DIAGNOSIS — O26643 Intrahepatic cholestasis of pregnancy, third trimester: Secondary | ICD-10-CM

## 2018-01-03 DIAGNOSIS — K831 Obstruction of bile duct: Secondary | ICD-10-CM

## 2018-01-03 DIAGNOSIS — Z3403 Encounter for supervision of normal first pregnancy, third trimester: Secondary | ICD-10-CM

## 2018-01-03 DIAGNOSIS — O99820 Streptococcus B carrier state complicating pregnancy: Secondary | ICD-10-CM

## 2018-01-03 DIAGNOSIS — O26613 Liver and biliary tract disorders in pregnancy, third trimester: Secondary | ICD-10-CM

## 2018-01-03 DIAGNOSIS — Z3400 Encounter for supervision of normal first pregnancy, unspecified trimester: Secondary | ICD-10-CM

## 2018-01-03 NOTE — Progress Notes (Signed)
   PRENATAL VISIT NOTE  Subjective:  Michelle Bridges is a 27 y.o. G1P0 at [redacted]w[redacted]d being seen today for ongoing prenatal care.  She is currently monitored for the following issues for this high-risk pregnancy and has Supervision of normal first pregnancy, antepartum; Vitamin D deficiency; GBS (group B Streptococcus carrier), +RV culture, currently pregnant; and Cholestasis during pregnancy in third trimester on their problem list.  Patient reports no complaints.  Contractions: Irritability. Vag. Bleeding: None.  Movement: Present. Denies leaking of fluid.   The following portions of the patient's history were reviewed and updated as appropriate: allergies, current medications, past family history, past medical history, past social history, past surgical history and problem list. Problem list updated.  Objective:   Vitals:   01/03/18 0926  BP: 104/71  Pulse: 71  Weight: 120 lb 12.8 oz (54.8 kg)    Fetal Status:     Movement: Present     General:  Alert, oriented and cooperative. Patient is in no acute distress.  Skin: Skin is warm and dry. No rash noted.   Cardiovascular: Normal heart rate noted  Respiratory: Normal respiratory effort, no problems with respiration noted  Abdomen: Soft, gravid, appropriate for gestational age.  Pain/Pressure: Present     Pelvic: Cervical exam deferred        Extremities: Normal range of motion.  Edema: Trace  Mental Status: Normal mood and affect. Normal behavior. Normal judgment and thought content.   Assessment and Plan:  Pregnancy: G1P0 at [redacted]w[redacted]d  1. Supervision of normal first pregnancy, antepartum Patient is doing well without complaints  2. Cholestasis during pregnancy in third trimester Continue Actigall Normal growth ultrasound IOL scheduled on 6/13 at 37 weeks NST reviewed and reactive with baseline 135, mod variability, +accels, no decels - US OB Limited; Future - Fetal nonstress test; Future  3. GBS (group B Streptococcus  carrier), +RV culture, currently pregnant Will receive prophylaxis in labor  Preterm labor symptoms and general obstetric precautions including but not limited to vaginal bleeding, contractions, leaking of fluid and fetal movement were reviewed in detail with the patient. Please refer to After Visit Summary for other counseling recommendations.  No follow-ups on file.  Future Appointments  Date Time Provider Department Center  01/06/2018  8:15 AM [facility] None  01/10/2018  8:30 AM [facility] ULTRASOUND [facility] None  01/10/2018  9:15 AM Chevi Lim, MD [facility] None  01/13/2018  8:15 AM [facility] None  01/17/2018  6:30 AM [facility] ROOM [facility] None    Catalina Antigua, MD

## 2018-01-03 NOTE — Telephone Encounter (Signed)
Interpreter number [phone] Preadmission screen

## 2018-01-03 NOTE — Progress Notes (Signed)
OB/NST/AFI 

## 2018-01-06 ENCOUNTER — Ambulatory Visit (INDEPENDENT_AMBULATORY_CARE_PROVIDER_SITE_OTHER): Payer: Medicaid Other

## 2018-01-06 DIAGNOSIS — K831 Obstruction of bile duct: Secondary | ICD-10-CM | POA: Diagnosis not present

## 2018-01-06 DIAGNOSIS — O26613 Liver and biliary tract disorders in pregnancy, third trimester: Secondary | ICD-10-CM | POA: Diagnosis not present

## 2018-01-06 NOTE — Progress Notes (Signed)
NST DX Cholestasis 

## 2018-01-06 NOTE — Progress Notes (Signed)
Patient with cholestasis of pregnancy here for NST NST reviewed and reactive with baseline 150, mod variability, +accels, no decels

## 2018-01-10 ENCOUNTER — Other Ambulatory Visit: Payer: Medicaid Other

## 2018-01-10 ENCOUNTER — Ambulatory Visit (INDEPENDENT_AMBULATORY_CARE_PROVIDER_SITE_OTHER): Payer: Medicaid Other | Admitting: Obstetrics and Gynecology

## 2018-01-10 ENCOUNTER — Other Ambulatory Visit (HOSPITAL_COMMUNITY)
Admission: RE | Admit: 2018-01-10 | Discharge: 2018-01-10 | Disposition: A | Discharge status: Home / Self Care | Payer: Medicaid Other | Source: Ambulatory Visit | Attending: Obstetrics and Gynecology | Admitting: Obstetrics and Gynecology

## 2018-01-10 ENCOUNTER — Encounter: Payer: Self-pay | Admitting: Obstetrics and Gynecology

## 2018-01-10 VITALS — BP 99/69 | HR 65 | Wt 120.1 lb

## 2018-01-10 DIAGNOSIS — Z3403 Encounter for supervision of normal first pregnancy, third trimester: Secondary | ICD-10-CM | POA: Insufficient documentation

## 2018-01-10 DIAGNOSIS — Z23 Encounter for immunization: Secondary | ICD-10-CM

## 2018-01-10 DIAGNOSIS — O99820 Streptococcus B carrier state complicating pregnancy: Secondary | ICD-10-CM

## 2018-01-10 DIAGNOSIS — K831 Obstruction of bile duct: Secondary | ICD-10-CM | POA: Diagnosis not present

## 2018-01-10 DIAGNOSIS — Z3400 Encounter for supervision of normal first pregnancy, unspecified trimester: Secondary | ICD-10-CM

## 2018-01-10 DIAGNOSIS — O26613 Liver and biliary tract disorders in pregnancy, third trimester: Secondary | ICD-10-CM

## 2018-01-10 NOTE — Progress Notes (Signed)
   PRENATAL VISIT NOTE  Subjective:  Michelle Bridges is a 27 y.o. G1P0 at [redacted]w[redacted]d being seen today for ongoing prenatal care.  She is currently monitored for the following issues for this high-risk pregnancy and has Supervision of normal first pregnancy, antepartum; Vitamin D deficiency; GBS (group B Streptococcus carrier), +RV culture, currently pregnant; and Cholestasis during pregnancy in third trimester on their problem list.  Patient reports no complaints.  Contractions: Irregular. Vag. Bleeding: None.  Movement: Present. Denies leaking of fluid.   The following portions of the patient's history were reviewed and updated as appropriate: allergies, current medications, past family history, past medical history, past social history, past surgical history and problem list. Problem list updated.  Objective:   Vitals:   01/10/18 0849  BP: 99/69  Pulse: 65  Weight: 120 lb 1.6 oz (54.5 kg)    Fetal Status: Fetal Heart Rate (bpm): NST   Movement: Present     General:  Alert, oriented and cooperative. Patient is in no acute distress.  Skin: Skin is warm and dry. No rash noted.   Cardiovascular: Normal heart rate noted  Respiratory: Normal respiratory effort, no problems with respiration noted  Abdomen: Soft, gravid, appropriate for gestational age.  Pain/Pressure: Present     Pelvic: Cervical exam deferred        Extremities: Normal range of motion.  Edema: Trace  Mental Status: Normal mood and affect. Normal behavior. Normal judgment and thought content.   Assessment and Plan:  Pregnancy: G1P0 at [redacted]w[redacted]d  1. Supervision of normal first pregnancy, antepartum Patient is doing well without complaints Discussed pain management options while in labor Patient still deciding on pediatrician - GC/Chlamydia probe amp ()not at [facility]  2. Cholestasis during pregnancy in third trimester Continue Actigall  IOL scheduled on 5/13; foley bulb placement on 6/12 in MAU at 6pm NST  reviewed and reactive with baseline 130, mod variability, +accels, no decels Patient unaware of contractions - US OB Limited - Fetal nonstress test  3. GBS (group B Streptococcus carrier), +RV culture, currently pregnant Will receive prophylaxis in labor  Preterm labor symptoms and general obstetric precautions including but not limited to vaginal bleeding, contractions, leaking of fluid and fetal movement were reviewed in detail with the patient. Please refer to After Visit Summary for other counseling recommendations.  No follow-ups on file.  Future Appointments  Date Time Provider Department Center  01/13/2018  8:15 AM [facility] None  01/17/2018  6:30 AM [facility] RO[facility] None    Catalina Antigua, MD

## 2018-01-11 LAB — GC/CHLAMYDIA PROBE AMP (~~LOC~~) NOT AT ARMC
CHLAMYDIA, DNA PROBE: NEGATIVE
Neisseria Gonorrhea: NEGATIVE

## 2018-01-13 ENCOUNTER — Ambulatory Visit (INDEPENDENT_AMBULATORY_CARE_PROVIDER_SITE_OTHER): Payer: Medicaid Other | Admitting: *Deleted

## 2018-01-13 VITALS — BP 97/70 | HR 82

## 2018-01-13 DIAGNOSIS — O26613 Liver and biliary tract disorders in pregnancy, third trimester: Secondary | ICD-10-CM | POA: Diagnosis not present

## 2018-01-13 DIAGNOSIS — K831 Obstruction of bile duct: Secondary | ICD-10-CM | POA: Diagnosis not present

## 2018-01-13 NOTE — Progress Notes (Signed)
I have reviewed the chart and agree with nursing staff's documentation of this patient's encounter.  Roxann Vierra A Sylvia Helms, CNM 01/13/2018 3:34 PM    

## 2018-01-13 NOTE — Progress Notes (Signed)
Pt is in office for NST due to cholestasis in pregnancy. NST reviewed with R.Denney, CNM and noted reactive. Pt removed from monitor.  Pt has induction scheduled next week with foley bulb placement prior to.  Pt has no other concerns.

## 2018-01-16 ENCOUNTER — Inpatient Hospital Stay (EMERGENCY_DEPARTMENT_HOSPITAL)
Admission: AD | Admit: 2018-01-16 | Discharge: 2018-01-16 | Disposition: A | Discharge status: Home / Self Care | Payer: Medicaid Other | Source: Ambulatory Visit | Attending: Obstetrics & Gynecology | Admitting: Obstetrics & Gynecology

## 2018-01-16 DIAGNOSIS — O26643 Intrahepatic cholestasis of pregnancy, third trimester: Secondary | ICD-10-CM

## 2018-01-16 DIAGNOSIS — K831 Obstruction of bile duct: Secondary | ICD-10-CM | POA: Diagnosis not present

## 2018-01-16 DIAGNOSIS — O26613 Liver and biliary tract disorders in pregnancy, third trimester: Secondary | ICD-10-CM

## 2018-01-16 DIAGNOSIS — Z3490 Encounter for supervision of normal pregnancy, unspecified, unspecified trimester: Secondary | ICD-10-CM

## 2018-01-16 DIAGNOSIS — O99820 Streptococcus B carrier state complicating pregnancy: Secondary | ICD-10-CM

## 2018-01-16 NOTE — MAU Provider Note (Signed)
   PRENATAL VISIT NOTE  Subjective:  Michelle Bridges is a 27 y.o. G1P0 at [redacted]w[redacted]d being seen today for ongoing prenatal care.  She is currently monitored for the following issues for this high-risk pregnancy and has Supervision of normal first pregnancy, antepartum; Vitamin D deficiency; GBS (group B Streptococcus carrier), +RV culture, currently pregnant; and Cholestasis during pregnancy in third trimester on their problem list.  Patient reports no complaints.   . Vag. Bleeding: None.   . Denies leaking of fluid.   The following portions of the patient's history were reviewed and updated as appropriate: allergies, current medications, past family history, past medical history, past social history, past surgical history and problem list. Problem list updated.  Objective:   Vitals:   01/16/18 1813 01/16/18 1821  BP:  100/74  Pulse:  93  Resp:  16  Temp:  98.6 F (37 C)  TempSrc:  Oral  Weight: 121 lb 1.9 oz (54.9 kg)     Fetal Status:        Presentation: Vertex  General:  Alert, oriented and cooperative. Patient is in no acute distress.  Skin: Skin is warm and dry. No rash noted.   Cardiovascular: Normal heart rate noted  Respiratory: Normal respiratory effort, no problems with respiration noted  Abdomen: Soft, gravid, appropriate for gestational age.        Pelvic: Cervical exam performed Dilation: 1.5 Effacement (%): 50 Station: -3  Extremities: Normal range of motion.     Mental Status:  Normal mood and affect. Normal behavior. Normal judgment and thought content.  Procedure: Patient informed of R/B/A of procedure. NST was performed and was reactive prior to procedure. Procedure done to begin ripening of the cervix prior to admission for induction of labor. Appropriate time out taken. The patient was placed in the lithotomy position and a cervical exam was performed and a finger was used to guide the 62F foley balloon through the internal os of the cervix. Foley Balloon  filled with 60cc of sterile water. Plug inserted into end of the foley. Foley placed on tension and taped to medical thigh. NST:  Baseline: 135 bpm there were no signs of tachysystole or hypertonus. All equipment was removed and accounted for. The patient tolerated the procedure well.  Assessment and Plan:  Pregnancy: G1P0 at [redacted]w[redacted]d @  S/p Outpatient placement of foley balloon catheter for cervical ripening. Induction of labor scheduled for tomorrow at 0630 am. Reassuring FHR tracing with no concerns at present. Warning signs given to patient to include return to MAU for heavy vaginal bleeding, Rupture of membranes, painful uterine contractions q 5 mins or less, severe abdominal discomfort, decreased fetal movement.   Raelyn Mora, CNM 01/16/2018 7:03 PM

## 2018-01-16 NOTE — Discharge Instructions (Signed)
OUTPATIENT FOLEY BULB INDUCTION OF LABOR:  Information Sheet for Mothers and Family               Whats a Foley Bulb Induction? A Foley bulb induction is a procedure where your provider inserts a catheter into your cervix. Once inside your womb, your provider inflates the balloon with a saline solution.   This puts pressure on your cervix and encourages dilation. The catheter falls out once your cervix dilates to 3-4 centimeters.  With any procedure, its important that you know what to expect. The insertion of a Foley catheter can be a bit uncomfortable, and some women experience sharp pelvic pain. The pain may subside once the catheter is in place. You may experience some cramping and small amount of bleeding when the Foley catheter is in place.  This is normal.     GO TO THE MATERNITY ADMISSIONS UNIT FOR THE FOLLOWING:  Heavy vaginal bleeding - like a period  Rupture of membranes (fluid that wets your underwear continuously)  Painful uterine contractions every 5 minutes or less  Severe abdominal discomfort  Decreased movement of the baby

## 2018-01-16 NOTE — MAU Note (Signed)
Pt here for placement of foley bulb. Scheduled for IOL on 01/17/18.

## 2018-01-17 ENCOUNTER — Inpatient Hospital Stay (HOSPITAL_COMMUNITY)
Admission: RE | Admit: 2018-01-17 | Discharge: 2018-01-19 | DRG: 768 | Disposition: A | Discharge status: Home / Self Care | Payer: Medicaid Other | Source: Ambulatory Visit | Attending: Obstetrics and Gynecology | Admitting: Obstetrics and Gynecology

## 2018-01-17 ENCOUNTER — Other Ambulatory Visit: Payer: Self-pay

## 2018-01-17 ENCOUNTER — Encounter (HOSPITAL_COMMUNITY): Payer: Self-pay

## 2018-01-17 VITALS — BP 100/61 | HR 57 | Temp 98.3°F | Resp 17 | Ht 60.0 in | Wt 120.6 lb

## 2018-01-17 DIAGNOSIS — Z3490 Encounter for supervision of normal pregnancy, unspecified, unspecified trimester: Secondary | ICD-10-CM | POA: Diagnosis present

## 2018-01-17 DIAGNOSIS — O99824 Streptococcus B carrier state complicating childbirth: Secondary | ICD-10-CM | POA: Diagnosis present

## 2018-01-17 DIAGNOSIS — Z3400 Encounter for supervision of normal first pregnancy, unspecified trimester: Secondary | ICD-10-CM

## 2018-01-17 DIAGNOSIS — Z3A37 37 weeks gestation of pregnancy: Secondary | ICD-10-CM

## 2018-01-17 DIAGNOSIS — K831 Obstruction of bile duct: Secondary | ICD-10-CM

## 2018-01-17 DIAGNOSIS — O2662 Liver and biliary tract disorders in childbirth: Secondary | ICD-10-CM | POA: Diagnosis present

## 2018-01-17 DIAGNOSIS — O26613 Liver and biliary tract disorders in pregnancy, third trimester: Secondary | ICD-10-CM

## 2018-01-17 LAB — CBC
HCT: 38.9 % (ref 36.0–46.0)
HEMOGLOBIN: 12.9 g/dL (ref 12.0–15.0)
MCH: 30.9 pg (ref 26.0–34.0)
MCHC: 33.2 g/dL (ref 30.0–36.0)
MCV: 93.3 fL (ref 78.0–100.0)
Platelets: 217 10*3/uL (ref 150–400)
RBC: 4.17 MIL/uL (ref 3.87–5.11)
RDW: 13.9 % (ref 11.5–15.5)
WBC: 10.2 10*3/uL (ref 4.0–10.5)

## 2018-01-17 LAB — ABO/RH: ABO/RH(D): A POS

## 2018-01-17 LAB — TYPE AND SCREEN
ABO/RH(D): A POS
Antibody Screen: NEGATIVE

## 2018-01-17 LAB — RPR: RPR Ser Ql: NONREACTIVE

## 2018-01-17 MED ORDER — OXYTOCIN BOLUS FROM INFUSION
500.0000 mL | Freq: Once | INTRAVENOUS | Status: AC
  Administered 2018-01-17: 500 mL via INTRAVENOUS

## 2018-01-17 MED ORDER — ACETAMINOPHEN 325 MG PO TABS: 650.0000 mg | ORAL_TABLET | ORAL | Status: DC | PRN

## 2018-01-17 MED ORDER — PRENATAL MULTIVITAMIN CH
1.0000 | ORAL_TABLET | Freq: Every day | ORAL | Status: DC
  Administered 2018-01-18: 1 via ORAL
  Filled 2018-01-17 (×2): qty 1

## 2018-01-17 MED ORDER — TETANUS-DIPHTH-ACELL PERTUSSIS 5-2.5-18.5 LF-MCG/0.5 IM SUSP: 0.5000 mL | Freq: Once | INTRAMUSCULAR | Status: DC

## 2018-01-17 MED ORDER — FENTANYL CITRATE (PF) 100 MCG/2ML IJ SOLN
100.0000 ug | INTRAMUSCULAR | Status: DC | PRN
  Administered 2018-01-17 (×3): 100 ug via INTRAVENOUS
  Filled 2018-01-17 (×3): qty 2

## 2018-01-17 MED ORDER — OXYTOCIN 40 UNITS IN LACTATED RINGERS INFUSION - SIMPLE MED
2.5000 [IU]/h | INTRAVENOUS | Status: DC
  Filled 2018-01-17: qty 1000

## 2018-01-17 MED ORDER — OXYCODONE-ACETAMINOPHEN 5-325 MG PO TABS: 1.0000 | ORAL_TABLET | ORAL | Status: DC | PRN

## 2018-01-17 MED ORDER — PENICILLIN G POT IN DEXTROSE 60000 UNIT/ML IV SOLN
3.0000 10*6.[IU] | INTRAVENOUS | Status: DC
  Administered 2018-01-17 (×2): 3 10*6.[IU] via INTRAVENOUS
  Filled 2018-01-17 (×4): qty 50

## 2018-01-17 MED ORDER — LACTATED RINGERS IV SOLN
INTRAVENOUS | Status: DC
  Administered 2018-01-17 (×2): via INTRAVENOUS

## 2018-01-17 MED ORDER — TERBUTALINE SULFATE 1 MG/ML IJ SOLN: 0.2500 mg | Freq: Once | INTRAMUSCULAR | Status: DC | PRN

## 2018-01-17 MED ORDER — LIDOCAINE HCL (PF) 1 % IJ SOLN
30.0000 mL | INTRAMUSCULAR | Status: DC | PRN
  Administered 2018-01-17: 30 mL via SUBCUTANEOUS
  Filled 2018-01-17: qty 30

## 2018-01-17 MED ORDER — ZOLPIDEM TARTRATE 5 MG PO TABS: 5.0000 mg | ORAL_TABLET | Freq: Every evening | ORAL | Status: DC | PRN

## 2018-01-17 MED ORDER — ONDANSETRON HCL 4 MG/2ML IJ SOLN: 4.0000 mg | Freq: Four times a day (QID) | INTRAMUSCULAR | Status: DC | PRN

## 2018-01-17 MED ORDER — COCONUT OIL OIL
1.0000 "application " | TOPICAL_OIL | Status: DC | PRN
  Filled 2018-01-17: qty 120

## 2018-01-17 MED ORDER — SENNOSIDES-DOCUSATE SODIUM 8.6-50 MG PO TABS
2.0000 | ORAL_TABLET | ORAL | Status: DC
  Administered 2018-01-18 (×2): 2 via ORAL
  Filled 2018-01-17 (×2): qty 2

## 2018-01-17 MED ORDER — LACTATED RINGERS IV SOLN: 500.0000 mL | INTRAVENOUS | Status: DC | PRN

## 2018-01-17 MED ORDER — DOCUSATE SODIUM 100 MG PO CAPS
100.0000 mg | ORAL_CAPSULE | Freq: Two times a day (BID) | ORAL | Status: DC
  Administered 2018-01-18 (×3): 100 mg via ORAL
  Filled 2018-01-17 (×4): qty 1

## 2018-01-17 MED ORDER — ONDANSETRON HCL 4 MG/2ML IJ SOLN: 4.0000 mg | INTRAMUSCULAR | Status: DC | PRN

## 2018-01-17 MED ORDER — CEFAZOLIN SODIUM-DEXTROSE 2-4 GM/100ML-% IV SOLN
2.0000 g | Freq: Once | INTRAVENOUS | Status: AC
  Administered 2018-01-17: 2 g via INTRAVENOUS
  Filled 2018-01-17: qty 100

## 2018-01-17 MED ORDER — DIBUCAINE 1 % RE OINT: 1.0000 "application " | TOPICAL_OINTMENT | RECTAL | Status: DC | PRN

## 2018-01-17 MED ORDER — OXYTOCIN 40 UNITS IN LACTATED RINGERS INFUSION - SIMPLE MED
1.0000 m[IU]/min | INTRAVENOUS | Status: DC
  Administered 2018-01-17: 8 m[IU]/min via INTRAVENOUS
  Administered 2018-01-17: 16 m[IU]/min via INTRAVENOUS
  Administered 2018-01-17: 2 m[IU]/min via INTRAVENOUS

## 2018-01-17 MED ORDER — DIPHENHYDRAMINE HCL 25 MG PO CAPS: 25.0000 mg | ORAL_CAPSULE | Freq: Four times a day (QID) | ORAL | Status: DC | PRN

## 2018-01-17 MED ORDER — SOD CITRATE-CITRIC ACID 500-334 MG/5ML PO SOLN: 30.0000 mL | ORAL | Status: DC | PRN

## 2018-01-17 MED ORDER — ONDANSETRON HCL 4 MG PO TABS: 4.0000 mg | ORAL_TABLET | ORAL | Status: DC | PRN

## 2018-01-17 MED ORDER — SODIUM CHLORIDE 0.9 % IV SOLN
5.0000 10*6.[IU] | Freq: Once | INTRAVENOUS | Status: AC
  Administered 2018-01-17: 5 10*6.[IU] via INTRAVENOUS
  Filled 2018-01-17: qty 5

## 2018-01-17 MED ORDER — OXYCODONE-ACETAMINOPHEN 5-325 MG PO TABS: 2.0000 | ORAL_TABLET | ORAL | Status: DC | PRN

## 2018-01-17 MED ORDER — IBUPROFEN 600 MG PO TABS
600.0000 mg | ORAL_TABLET | Freq: Four times a day (QID) | ORAL | Status: DC
  Administered 2018-01-17 – 2018-01-19 (×6): 600 mg via ORAL
  Filled 2018-01-17 (×7): qty 1

## 2018-01-17 MED ORDER — BENZOCAINE-MENTHOL 20-0.5 % EX AERO
1.0000 "application " | INHALATION_SPRAY | CUTANEOUS | Status: DC | PRN
  Administered 2018-01-17: 1 via TOPICAL
  Filled 2018-01-17: qty 56

## 2018-01-17 MED ORDER — WITCH HAZEL-GLYCERIN EX PADS: 1.0000 "application " | MEDICATED_PAD | CUTANEOUS | Status: DC | PRN

## 2018-01-17 MED ORDER — SIMETHICONE 80 MG PO CHEW: 80.0000 mg | CHEWABLE_TABLET | ORAL | Status: DC | PRN

## 2018-01-17 NOTE — Anesthesia Pain Management Evaluation Note (Deleted)
  CRNA Pain Management Visit Note  Patient: Michelle Bridges, 27 y.o., female  "Hello I am a member of the anesthesia team at [facility]. We have an anesthesia team available at all times to provide care throughout the hospital, including epidural management and anesthesia for C-section. I don't know your plan for the delivery whether it a natural birth, water birth, IV sedation, nitrous supplementation, doula or epidural, but we want to meet your pain goals."   1.Was your pain managed to your expectations on prior hospitalizations?   Yes   2.What is your expectation for pain management during this hospitalization?     Epidural  3.How can we help you reach that goal? epidura  Record the patient's initial score and the patient's pain goal.   Pain: 5/10  Pain Goal: 0/10 [facility] wants you to be able to say your pain was always managed very well.  Salome Arnt 01/17/2018

## 2018-01-17 NOTE — H&P (Signed)
Obstetric History and Physical  Michelle Bridges is a 27 y.o. G1P0 with IUP at [redacted]w[redacted]d presenting for IOL 2/2 cholestasis of pregnancy. Pateient had FB placement yesterday in MAU to start her induction. States that FB came out around 1am. Patient states she has been having  Irregular contractions, minimal vaginal bleeding, intact membranes, with active fetal movement.    Prenatal Course Source of Care: [facility]  Dating: By LMP --->  Estimated Date of Delivery: 02/07/18 Pregnancy complications or risks: Patient Active Problem List   Diagnosis Date Noted  . Cholestasis during pregnancy in third trimester 11/19/2017  . GBS (group B Streptococcus carrier), +RV culture, currently pregnant 09/13/2017  . Vitamin D deficiency 08/26/2017  . Supervision of normal first pregnancy, antepartum 08/23/2017   She plans to breastfeed She desires oral progesterone-only contraceptive for postpartum contraception.   Sono:    , CWD, normal anatomy, cephalic presentation, posterior placenta, 2155g, 55% EFW, AFI wnl, BPP 8/8  Prenatal labs and studies: ABO, Rh: A/Positive/-- (12/17 1537) Antibody: Negative (12/17 1537) Rubella: 2.96 (12/17 1537) RPR: Non Reactive (03/11 1026)  HBsAg: Negative (12/17 1537)  HIV: Non Reactive (03/11 1026)  GBS: positive in urine  2 hr Glucola  normal Genetic screening normal Anatomy US normal  Prenatal Transfer Tool  Maternal Diabetes: No Genetic Screening: Normal Maternal Ultrasounds/Referrals: Normal Fetal Ultrasounds or other Referrals:  None Maternal Substance Abuse:  No Significant Maternal Medications:  Meds include: Other: Actigall Significant Maternal Lab Results: Lab values include: Group B Strep positive  No past medical history on file.  Past Surgical History:  Procedure Laterality Date  . LASIK      OB History  Gravida Para Term Preterm AB Living  1            SAB TAB Ectopic Multiple Live Births               # Outcome Date GA Lbr  Len/2nd Weight Sex Delivery Anes PTL Lv  1 Current             Social History   Socioeconomic History  . Marital status: Single    Spouse name: Not on file  . Number of children: Not on file  . Years of education: Not on file  . Highest education level: Not on file  Occupational History  . Not on file  Social Needs  . Financial resource strain: Not on file  . Food insecurity:    Worry: Not on file    Inability: Not on file  . Transportation needs:    Medical: Not on file    Non-medical: Not on file  Tobacco Use  . Smoking status: Never Smoker  . Smokeless tobacco: Never Used  Substance and Sexual Activity  . Alcohol use: No  . Drug use: No  . Sexual activity: Yes    Birth control/protection: None  Lifestyle  . Physical activity:    Days per week: Not on file    Minutes per session: Not on file  . Stress: Not on file  Relationships  . Social connections:    Talks on phone: Not on file    Gets together: Not on file    Attends religious service: Not on file    Active member of club or organization: Not on file    Attends meetings of clubs or organizations: Not on file    Relationship status: Not on file  Other Topics Concern  . Not on file  Social History Narrative  .  Not on file    Family History  Problem Relation Age of Onset  . Asthma Maternal Aunt   . Diabetes Maternal Aunt   . Diabetes Paternal Grandmother     No medications prior to admission.    No Known Allergies  Review of Systems: Negative except for what is mentioned in HPI.  Physical Exam: LMP 05/03/2017 (Exact Date)  CONSTITUTIONAL: Well-developed, well-nourished female in no acute distress.  HENT:  Normocephalic, atraumatic, External right and left ear normal. Oropharynx is clear and moist EYES: Conjunctivae and EOM are normal. Pupils are equal, round, and reactive to light. No scleral icterus.  NECK: Normal range of motion, supple, no masses SKIN: Skin is warm and dry. No rash noted.  Not diaphoretic. No erythema. No pallor. NEUROLOGIC: Alert and oriented to person, place, and time. Normal reflexes, muscle tone coordination. No cranial nerve deficit noted. PSYCHIATRIC: Normal mood and affect. Normal behavior. Normal judgment and thought content. CARDIOVASCULAR: Normal heart rate noted, regular rhythm RESPIRATORY: Effort and breath sounds normal, no problems with respiration noted ABDOMEN: Soft, nontender, nondistended, gravid. MUSCULOSKELETAL: Normal range of motion. No edema and no tenderness. 2+ distal pulses.  Cervical Exam: Dilation: 4.5 Effacement (%): 60 Cervical Position: Posterior Station: -2 Presentation: Vertex Exam by:: Cherene Dobbins  FHT:  Baseline rate 150 bpm   Variability moderate  Accelerations present   Decelerations none Contractions: Every 2-8 mins, irregular   Pertinent Labs/Studies:   No results found for this or any previous visit (from the past 24 hour(s)).  Assessment : Michelle Bridges is a 27 y.o. G1P0 at [redacted]w[redacted]d being admitted for induction of labor due to ICP.  Plan: Labor: S/p outpatient FB. Will continue induction with Pitocin.   Analgesia as needed. FWB: Reassuring fetal heart tracing.   GBS positive - start PCN Delivery plan: Hopeful for vaginal delivery   Caryl Ada, DO OB Fellow Faculty Practice, [facility] - [facility] 01/17/2018, 6:28 AM

## 2018-01-17 NOTE — Progress Notes (Signed)
   Michelle Bridges is a 27 y.o. G1P0 at [redacted]w[redacted]d  admitted for  Cholestasis with BA of 14.   Subjective: Comfortable; not desiring epidural yet.   Objective: Vitals:   01/17/18 1402 01/17/18 1441 01/17/18 1503 01/17/18 1509  BP: 103/62 106/68 (!) 107/59   Pulse: 62 74 74   Resp: Temp:    97.9 F (36.6 C)  TempSrc:    Oral  Weight:      Height:       No intake/output data recorded.  FHT:  FHR: 135 bpm, variability: moderate,  accelerations:  Present,  decelerations:  Absent UC:   regular, every 2 minutes SVE:   Dilation: 4 Effacement (%): 60 Station: 0 Exam by:: kathryn, cnm Pitocin @ 14 mu/min  Labs: Lab Results  Component Value Date   WBC 10.2 01/17/2018   HGB 12.9 01/17/2018   HCT 38.9 01/17/2018   MCV 93.3 01/17/2018   PLT 217 01/17/2018    Assessment / Plan: Coping well; AROM clear bag at 1430  Labor: early labor Fetal Wellbeing:  Category I Pain Control:  Labor support without medications Anticipated MOD:  NSVD  Marylene Land 01/17/2018, 3:23 PM

## 2018-01-17 NOTE — Lactation Note (Signed)
This note was copied from a baby's chart. Lactation Consultation Note  Patient Name: Michelle Bridges Date: 01/17/2018 Reason for consult: Initial assessment;1st time breastfeeding;Primapara;Early term 37-38.6wks  P1 mother whose infant is now 12 hours old.  This is an ETI at 37 weeks.  Infant is swaddled and being held by grandparents as I entered.  Discussed with parents the ETI and to do a lot of STS with baby.  Reviewed feeding cues, feeding 8-12 times/24 hours or earlier if he shows cues, breast massage and hand expression.  Provided a colostrum container and spoon for mother to feed any EBM she may obtain from hand expression.  Encouraged mother to call for latch assistance with next feeding.  I did not assess breasts/nipples at this time due to visitors in room.  Mother stated that she felt he latched on well one time since birth.  She denies pain to nipples.    Mom made aware of O/P services, breastfeeding support groups, community resources, and our phone # for post-discharge questions.  Maternal Data Formula Feeding for Exclusion: No Has patient been taught Hand Expression?: Yes Does the patient have breastfeeding experience prior to this delivery?: No  Feeding Feeding Type: Breast Milk  LATCH Score Latch: Repeated attempts needed to sustain latch, nipple held in mouth throughout feeding, stimulation needed to elicit sucking reflex.  Audible Swallowing: None  Type of Nipple: Flat  Comfort (Breast/Nipple): Soft / non-tender  Hold (Positioning): Assistance needed to correctly position infant at breast and maintain latch.  LATCH Score: 5  Interventions Interventions: Breast feeding basics reviewed;Assisted with latch;Skin to skin;Breast massage;Hand express;Adjust position;Support pillows;Position options  Lactation Tools Discussed/Used     Consult Status Consult Status: Follow-up Date: 01/18/18 Follow-up type: In-patient    Dora Sims 01/17/2018, 10:56 PM

## 2018-01-17 NOTE — Progress Notes (Signed)
   Michelle Bridges is a 27 y.o. G1P0 at [redacted]w[redacted]d  admitted for induction of labor due to cholestasis of pregnancy.  Subjective: Coping well; bouncing on birth ball.    Objective: Vitals:   01/17/18 1018 01/17/18 1147 01/17/18 1230 01/17/18 1307  BP: 99/67 101/65 104/64 103/71  Pulse: 73 76 61 78  Resp: Temp:      TempSrc:      Weight:      Height:       No intake/output data recorded.  FHT:  FHR: 140 bpm, variability: moderate,  accelerations:  Present,  decelerations:  Absent UC:   regular, every 1-3 minutes SVE:   Dilation: 4 Effacement (%): 60 Station: -2 Exam by:: Michelle jones, rn and Michelle fails, rn Pitocin @ 10 mu/min  Labs: Lab Results  Component Value Date   WBC 10.2 01/17/2018   HGB 12.9 01/17/2018   HCT 38.9 01/17/2018   MCV 93.3 01/17/2018   PLT 217 01/17/2018    Assessment / Plan: Induction of labor due to cholestasis,  progressing well on pitocin  Labor: early labor; on pitocin.  Fetal Wellbeing:  Category I Pain Control:  Labor support without medications Anticipated MOD:  NSVD  Michelle Bridges 01/17/2018, 1:28 PM

## 2018-01-17 NOTE — Anesthesia Pain Management Evaluation Note (Signed)
  CRNA Pain Management Visit Note  Patient: Michelle Bridges, 27 y.o., female  "Hello I am a member of the anesthesia team at [facility]. We have an anesthesia team available at all times to provide care throughout the hospital, including epidural management and anesthesia for C-section. I don't know your plan for the delivery whether it a natural birth, water birth, IV sedation, nitrous supplementation, doula or epidural, but we want to meet your pain goals."   1.Was your pain managed to your expectations on prior hospitalizations?   Yes   2.What is your expectation for pain management during this hospitalization?     Labor support without medications  3.How can we help you reach that goal? Nursing support Record the patient's initial score and the patient's pain goal.   Pain: 5/10  Pain Goal:  The [facility] wants you to be able to say your pain was always managed very well.  Salome Arnt 01/17/2018

## 2018-01-18 ENCOUNTER — Encounter (HOSPITAL_COMMUNITY): Payer: Self-pay

## 2018-01-18 LAB — CBC
HEMATOCRIT: 28.5 % — AB (ref 36.0–46.0)
HEMOGLOBIN: 9.7 g/dL — AB (ref 12.0–15.0)
MCH: 31.1 pg (ref 26.0–34.0)
MCHC: 34 g/dL (ref 30.0–36.0)
MCV: 91.3 fL (ref 78.0–100.0)
Platelets: 186 10*3/uL (ref 150–400)
RBC: 3.12 MIL/uL — AB (ref 3.87–5.11)
RDW: 14 % (ref 11.5–15.5)
WBC: 11.4 10*3/uL — ABNORMAL HIGH (ref 4.0–10.5)

## 2018-01-18 MED ORDER — IBUPROFEN 600 MG PO TABS: 600.0000 mg | ORAL_TABLET | Freq: Four times a day (QID) | ORAL | 0 refills | Status: DC

## 2018-01-18 NOTE — Lactation Note (Signed)
This note was copied from a baby's chart. Lactation Consultation Note  Patient Name: Michelle Bridges ZOXWR'U Date: 01/18/2018 Reason for consult: Follow-up assessment;Early term 42-38.6wks  Visited with P1 Mom of ET infant born at [redacted]w[redacted]d.  Baby 20 hrs old and hasn't latched and fed yet.  Mom has spoon fed 3 ml, and a few drops.    Baby swaddled in Mom's arms.   Offered to assist with positioning and latch.  Baby unable to maintain a deep latch onto breast.  Initiated a 20 mm nipple shield with instructions on how to properly apply.  Nipple pulled well into shield.  Baby latched, sucked a few times before falling asleep. Initiated formula supplementation via SNS under nipple shield.  Baby took 3 ml while latched onto breast.  Baby fed remainder of supplementation with SNS on finger. Instructed Mom to post breastfeed pump to support her milk supply.   Plan- 1-Breast feeding STS often on cue (goal >8 feedings per 24 hrs) 2-Supplement with EBM+/formula using SNS at the breast (10 ml today) 3- Pump both breasts 15-20 mins, breast massage and hand expression 4- ask for help prn.   Feeding Feeding Type: Formula Length of feed: 20 min  LATCH Score Latch: Repeated attempts needed to sustain latch, nipple held in mouth throughout feeding, stimulation needed to elicit sucking reflex.  Audible Swallowing: Spontaneous and intermittent(with supplementation)  Type of Nipple: Everted at rest and after stimulation  Comfort (Breast/Nipple): Soft / non-tender  Hold (Positioning): Assistance needed to correctly position infant at breast and maintain latch.  LATCH Score: 8  Interventions Interventions: Breast feeding basics reviewed;Assisted with latch;Skin to skin;Breast massage;Hand express;Breast compression;Adjust position;Support pillows;Position options;Expressed milk;DEBP  Lactation Tools Discussed/Used Tools: Pump;Nipple Dorris Carnes;Supplemental Nutrition System Nipple shield size:  20 Breast pump type: Double-Electric Breast Pump   Consult Status Consult Status: Follow-up Date: 01/19/18 Follow-up type: In-patient    [name] 01/18/2018, 3:54 PM

## 2018-01-18 NOTE — Lactation Note (Signed)
This note was copied from a baby's chart. Lactation Consultation Note  Patient Name: Michelle Bridges ZOXWR'U Date: 01/18/2018   [facility] Follow Up Visit:  Infant is an ETI at 10 hours of age.  He has been sleepy and has only breastfed one time at 0200.  Mother stated that he awakens and acts hungry until she puts him to breast and then he falls asleep.  She cannot get him to latch and would like assistance.  His suck on my gloved finger started out weak and uncoordinated but after about 5 minutes of suck training he was able to suck more effectively.  Attempted to latch baby in the football hold on the right breast.  He would not open his mouth to latch.  Gentle stimulation provided and demonstrated techniques to awaken him for mother.  A few more attempts were made to obtain an open mouth but infant was not interested and fell back asleep.  Mother was not able to express any colostrum with hand expression.  Initiated the DEBP with #24 flanges which are appropriate at this time.  Instructions given in the parts, care and cleaning of the pump.  Mother was only able to obtain a couple of drops of colostrum after pumping which was finger fed back to baby.    I suggested more STS and to continue watching him for feeding cues.  Reminded mother to awaken him every 3 hours if he does not self awaken.  She will latch and call for assistance as needed.  After latching she will post pump for 15 minutes and feed back any EBM she may obtain.  Mother verbalized understanding of feeding plan.  She will call for assistance as needed.  FOB and grandmother present and sleeping.                    Torrence Branagan R Maegen Wigle 01/18/2018, 5:45 AM

## 2018-01-18 NOTE — Discharge Instructions (Signed)
Vaginal Delivery, Care After °Refer to this sheet in the next few weeks. These instructions provide you with information about caring for yourself after vaginal delivery. Your health care provider may also give you more specific instructions. Your treatment has been planned according to current medical practices, but problems sometimes occur. Call your health care provider if you have any problems or questions. °What can I expect after the procedure? °After vaginal delivery, it is common to have: °· Some bleeding from your vagina. °· Soreness in your abdomen, your vagina, and the area of skin between your vaginal opening and your anus (perineum). °· Pelvic cramps. °· Fatigue. ° °Follow these instructions at home: °Medicines °· Take over-the-counter and prescription medicines only as told by your health care provider. °· If you were prescribed an antibiotic medicine, take it as told by your health care provider. Do not stop taking the antibiotic until it is finished. °Driving ° °· Do not drive or operate heavy machinery while taking prescription pain medicine. °· Do not drive for 24 hours if you received a sedative. °Lifestyle °· Do not drink alcohol. This is especially important if you are breastfeeding or taking medicine to relieve pain. °· Do not use tobacco products, including cigarettes, chewing tobacco, or e-cigarettes. If you need help quitting, ask your health care provider. °Eating and drinking °· Drink at least 8 eight-ounce glasses of water every day unless you are told not to by your health care provider. If you choose to breastfeed your baby, you may need to drink more water than this. °· Eat high-fiber foods every day. These foods may help prevent or relieve constipation. High-fiber foods include: °? Whole grain cereals and breads. °? Brown rice. °? Beans. °? Fresh fruits and vegetables. °Activity °· Return to your normal activities as told by your health care provider. Ask your health care provider  what activities are safe for you. °· Rest as much as possible. Try to rest or take a nap when your baby is sleeping. °· Do not lift anything that is heavier than your baby or 10 lb (4.5 kg) until your health care provider says that it is safe. °· Talk with your health care provider about when you can engage in sexual activity. This may depend on your: °? Risk of infection. °? Rate of healing. °? Comfort and desire to engage in sexual activity. °Vaginal Care °· If you have an episiotomy or a vaginal tear, check the area every day for signs of infection. Check for: °? More redness, swelling, or pain. °? More fluid or blood. °? Warmth. °? Pus or a bad smell. °· Do not use tampons or douches until your health care provider says this is safe. °· Watch for any blood clots that may pass from your vagina. These may look like clumps of dark red, brown, or black discharge. °General instructions °· Keep your perineum clean and dry as told by your health care provider. °· Wear loose, comfortable clothing. °· Wipe from front to back when you use the toilet. °· Ask your health care provider if you can shower or take a bath. If you had an episiotomy or a perineal tear during labor and delivery, your health care provider may tell you not to take baths for a certain length of time. °· Wear a bra that supports your breasts and fits you well. °· If possible, have someone help you with household activities and help care for your baby for at least a few days after   you leave the hospital. °· Keep all follow-up visits for you and your baby as told by your health care provider. This is important. °Contact a health care provider if: °· You have: °? Vaginal discharge that has a bad smell. °? Difficulty urinating. °? Pain when urinating. °? A sudden increase or decrease in the frequency of your bowel movements. °? More redness, swelling, or pain around your episiotomy or vaginal tear. °? More fluid or blood coming from your episiotomy or  vaginal tear. °? Pus or a bad smell coming from your episiotomy or vaginal tear. °? A fever. °? A rash. °? Little or no interest in activities you used to enjoy. °? Questions about caring for yourself or your baby. °· Your episiotomy or vaginal tear feels warm to the touch. °· Your episiotomy or vaginal tear is separating or does not appear to be healing. °· Your breasts are painful, hard, or turn red. °· You feel unusually sad or worried. °· You feel nauseous or you vomit. °· You pass large blood clots from your vagina. If you pass a blood clot from your vagina, save it to show to your health care provider. Do not flush blood clots down the toilet without having your health care provider look at them. °· You urinate more than usual. °· You are dizzy or light-headed. °· You have not breastfed at all and you have not had a menstrual period for 12 weeks after delivery. °· You have stopped breastfeeding and you have not had a menstrual period for 12 weeks after you stopped breastfeeding. °Get help right away if: °· You have: °? Pain that does not go away or does not get better with medicine. °? Chest pain. °? Difficulty breathing. °? Blurred vision or spots in your vision. °? Thoughts about hurting yourself or your baby. °· You develop pain in your abdomen or in one of your legs. °· You develop a severe headache. °· You faint. °· You bleed from your vagina so much that you fill two sanitary pads in one hour. °This information is not intended to replace advice given to you by your health care provider. Make sure you discuss any questions you have with your health care provider. °Document Released: 08/21/2000 Document Revised: 02/05/2016 Document Reviewed: 09/08/2015 °Elsevier Interactive Patient Education © 2018 [employer] ° °

## 2018-01-18 NOTE — Progress Notes (Signed)
Post Partum Day 1 Subjective: no complaints, up ad lib, voiding, tolerating PO and + flatus  Objective: Blood pressure (!) 91/57, pulse 64, temperature 98.8 F (37.1 C), temperature source Oral, resp. rate 16, height 5' (1.524 m), weight 120 lb 9.6 oz (54.7 kg), last menstrual period 05/03/2017, SpO2 99 %, unknown if currently breastfeeding.  Physical Exam:  General: alert, cooperative and no distress Lochia: appropriate Uterine Fundus: firm Incision: n/a  DVT Evaluation: No evidence of DVT seen on physical exam.  Recent Labs    01/17/18 0708 01/18/18 0549  HGB 12.9 9.7*  HCT 38.9 28.5*    Assessment/Plan: Plan for discharge tomorrow   LOS: 1 day   Wynelle Bourgeois 01/18/2018, 6:56 AM

## 2018-01-19 LAB — BIRTH TISSUE RECOVERY COLLECTION (PLACENTA DONATION)

## 2018-01-19 NOTE — Lactation Note (Addendum)
This note was copied from a baby's chart. Lactation Consultation Note  Patient Name: Michelle Bridges [id number] Date: 01/19/2018 Reason for consult: Follow-up assessment  Visited with P1 Mom on day of discharge, baby 47 hrs old, and at 6% weight loss.  Baby 5 lbs 15.9 oz today.  Mom has been feeding baby at the breast, using a nipple shield and SNS each time.  Volume of supplement increased to 15-20 ml today of EBM+/formula.   Mom has been double pumping regularly after breastfeeding.  Able to express more colostrum about 5-10 ml.  Mom knows to use her milk over formula as volume increases.   Watched as Mom set up SNS independently, and applied nipple shield well.  Baby able to latch well, Mom using firm breast support during sucking bursts.   [facility] referral sent for pump at discharge.  Referral sent to OP lactation for follow up.   Mom aware of OP lactation support available, and encouraged to call.  Feeding Feeding Type: Breast Fed Length of feed: 15 min  LATCH Score Latch: Grasps breast easily, tongue down, lips flanged, rhythmical sucking.  Audible Swallowing: Spontaneous and intermittent(with supplement via SNS)  Type of Nipple: Everted at rest and after stimulation  Comfort (Breast/Nipple): Soft / non-tender  Hold (Positioning): No assistance needed to correctly position infant at breast.  LATCH Score: 10  Interventions Interventions: Breast feeding basics reviewed;Skin to skin;Breast massage;Hand express;DEBP;Expressed milk  Lactation Tools Discussed/Used Tools: Pump Nipple shield size: 20 Breast pump type: Double-Electric Breast Pump   Consult Status Consult Status: Follow-up Date: 01/20/18 Follow-up type: In-patient    [name] 01/19/2018, 9:18 AM

## 2018-01-19 NOTE — Discharge Summary (Signed)
OB Discharge Summary     Patient Name: Michelle Bridges DOB: 04-Jul-1991 MRN: [id number]  Date of admission: 01/17/2018 Delivering MD: Rolm Bookbinder   Date of discharge: 01/19/2018  Admitting diagnosis: INDUCTION Intrauterine pregnancy: [redacted]w[redacted]d     Secondary diagnosis:  Active Problems:   Encounter for induction of labor   Postpartum care following vaginal delivery Cholestasis of pregnancy  Additional problems: none     Discharge diagnosis: Term Pregnancy Delivered and Cholestasis                                                                                                Post partum procedures:none  Augmentation: AROM, Pitocin and Foley Balloon  Complications: None  Hospital course:  Induction of Labor With Vaginal Delivery   27 y.o. yo G1P1001 at [redacted]w[redacted]d was admitted to the hospital 01/17/2018 for induction of labor.  Indication for induction: Cholestasis of pregnancy.  Patient had an uncomplicated labor course as follows: Membrane Rupture Time/Date: 2:34 PM ,01/17/2018   Intrapartum Procedures: Episiotomy: None [1]                                         Lacerations:  3rd degree [4];Periurethral [8]  Patient had delivery of a Viable infant.  Information for the patient's newborn:  Sianni, Cloninger [[id number]]  Delivery Method: Vaginal, Spontaneous(Filed from Delivery Summary)   01/17/2018  Details of delivery can be found in separate delivery note.  Patient had a routine postpartum course. Patient is discharged home 01/19/18.  Physical exam  Vitals:   01/18/18 0530 01/18/18 1140 01/18/18 1728 01/19/18 0533  BP: (!) 100/61  Pulse: 64 72 63 (!) 57  Resp: Temp: 98.8 F (37.1 C) 98.4 F (36.9 C) 98.7 F (37.1 C) 98.3 F (36.8 C)  TempSrc: Oral Axillary Oral Oral  SpO2: 99%   99%  Weight:      Height:       General: alert, cooperative and no distress Lochia: appropriate Uterine Fundus: firm Incision: Healing well  with no significant drainage DVT Evaluation: No evidence of DVT seen on physical exam. Labs: Lab Results  Component Value Date   WBC 11.4 (H) 01/18/2018   HGB 9.7 (L) 01/18/2018   HCT 28.5 (L) 01/18/2018   MCV 91.3 01/18/2018   PLT 186 01/18/2018   CMP Latest Ref Rng & Units 11/15/2017  Glucose 65 - 99 mg/dL 73  BUN 6 - 20 mg/dL 8  Creatinine 7.82 - 9.56 mg/dL 2.13(Y)  Sodium 865 - 784 mmol/L 141  Potassium 3.5 - 5.2 mmol/L 4.2  Chloride 96 - 106 mmol/L 102  CO2 20 - 29 mmol/L 19(L)  Calcium 8.7 - 10.2 mg/dL 9.3  Total Protein 6.0 - 8.5 g/dL 6.5  Total Bilirubin 0.0 - 1.2 mg/dL <6.9  Alkaline Phos 39 - 117 IU/L 114  AST 0 - 40 IU/L 26  ALT 0 - 32 IU/L 20    Discharge instruction: per After Visit  Summary and "Baby and Me Booklet".  After visit meds:  Allergies as of 01/19/2018   No Known Allergies     Medication List    TAKE these medications   acetaminophen 325 MG tablet Commonly known as:  TYLENOL Take 650 mg by mouth every 6 (six) hours as needed for mild pain or headache.   ibuprofen 600 MG tablet Commonly known as:  ADVIL,MOTRIN Take 1 tablet (600 mg total) by mouth every 6 (six) hours.   omeprazole 20 MG capsule Commonly known as:  PRILOSEC Take 1 capsule (20 mg total) by mouth 2 (two) times daily before a meal.   prenatal multivitamin Tabs tablet Take 1 tablet by mouth daily at 12 noon.   ursodiol 500 MG tablet Commonly known as:  ACTIGALL Take 1 tablet (500 mg total) by mouth 2 (two) times daily.   Vitamin D (Ergocalciferol) 50000 units Caps capsule Commonly known as:  DRISDOL Take 1 capsule (50,000 Units total) by mouth every 7 (seven) days.       Diet: routine diet  Activity: Advance as tolerated. Pelvic rest for 6 weeks.   Outpatient follow up:4 weeks Follow up Appt: Future Appointments  Date Time Provider Department Center  02/14/2018 10:00 AM Adam Phenix, MD [facility] None   Follow up Visit:No follow-ups on file.  Postpartum  contraception: Not Discussed  Newborn Data: Live born female  Birth Weight: 6 lb 6.3 oz (2900 g) APGAR: 8, 9  Newborn Delivery   Birth date/time:  01/17/2018 19:04:00 Delivery type:  Vaginal, Spontaneous     Baby Feeding: Breast Disposition:home with mother   01/19/2018 Wynelle Bourgeois, CNM

## 2018-01-21 ENCOUNTER — Ambulatory Visit: Payer: Self-pay

## 2018-01-21 NOTE — Lactation Note (Signed)
This note was copied from a baby's chart. 01/21/2018  Name: Gus Rankin MRN: [id number] Date of Birth: 01/17/2016 Gestational Age: Gestational Age: [redacted]w[redacted]d Birth Weight: 102.3 oz Weight today:   5 pounds 13.7 ounces (2658 grams) with clean newborn diaper   Koleen Nimrod has lost 62 grams in the last 2 days since d/c from the hospital and 49 grams in the last 24 hours.   Koleen Nimrod presents today with both parents for feeding assessment. Koleen Nimrod is a early term infant with an adjusted age of 37 weeks 5 days.   Mom reports she awakens him to feed every 2-3 hours and she is using the # 20 NS and the starter SNS at the breast. Infant is currently taking 30 ml formula in the SNS with each feeding. Mom reports infant is very sleepy at the breast. Mom reports infant still seems hungry post feeding, enc mom to increase volume as long as infant showing feeding cues. Reviewed with parents that infant still losing weight. Stools are lightening in color and voids and stools have increased. Advised parents to supplement infant post BF with Finger feeding and SNS or with bottle feeding. Mom has Tommie Tippee and [employer] bottles at home, discussed that a slower flow nipple such as DR. Brown's Level 1 nipple may be better for transitioning from breast to bottles.   Mom's milk is in, she is pumping 2 x a day with manual pump. Enc mom to pump after every feeding to prevent engorgement and to use her breast milk to supplement infant as well as protect her milk supply.   Infant is jaundiced in appearance, mom reports his levels have been checked and it was ok.   Mom latched infant to the right breast in the football hold, infant was not able to latch to the breast without the NS. Mom placed NS and fed infant , infant transferred 2 ml. Mom pumped 18 cc EBm and SNS and # 20 NS were placed by mom and infant relatched. Infant sleepy and falls asleep easily. Mom awakens infant as needed with feeding with burping, breast massage  and stimulation. Infant transferred very little at the breast, infant leaks a lot at the breast with the NS and SNS in place.   Infant finger fed 18 cc with the SNS by dad. We then added another 10 cc EBM that mom pumped to the SNS and infant was finger fed again by dad and took 5 ml. Infant asleep post feeding. Mom pumped an additional 38 ml with manual pump.   Advised parents what to expect as infant is LPT infant. Discussed that the way he is feeding is not uncommon for a LPT infant. Advised parents to supplement infant after each BF with finger or bottle feeding. Stressed importance of calories in the infant.  Discussed that if infant will not feed at the breast or with finger feeding, he must be bottle fed.   Enc mom to count feedings from beginning of feedings to beginning of next feeding. She has been counting from the end of the feedings. Mom has an app on her phone to use.   Mom to meet with [facility] next week for pump. [facility] loaner completed and mom took home Symphony pump for use. Enc mom to pump 8 x a day post BF to protect supply and prevent engorgement. Reviewed engorgement prevention/treament.   Infant to follow up with Lactation on Monday or Tuesday. Infant to follow up with Ped on Wednesday. [facility] nurse will  come out on Friday next week.   Parents report all questions/concerns have been answered at this time.     General Information: Mother's reason for visit: feeding with SNS, NS, Early Term infant Consult: Initial Lactation consultant: Jasmine December Haileigh Pitz RN,IBCLC Breastfeeding experience: going well, sleepy at the breast    Maternal medications: Pre-natal vitamin, Motrin (ibuprofen)  Breastfeeding History: Frequency of breast feeding: every 2-3 hours Duration of feeding: 30-35 ml  Supplementation: Supplement method: supplemental nursing system (SNS) Brand: Daron Offer Formula volume: 30 ml Formula frequency: 7-8 x a day Total formula volume per day: 210-240 ml        Pump type: Manual Pump frequency: 2 x a day Pump volume: 20 ml  Infant Output Assessment: Voids per 24 hours: 8+ Urine color: Clear yellow Stools per 24 hours: 8+ Stool color: Brown  Breast Assessment: Breast: Full Nipple: Erect Pain level: 2 Pain interventions: Bra  Feeding Assessment: Infant oral assessment: WNL   Positioning: Football(right breast) Latch: 1 - Repeated attempts needed to sustain latch, nipple held in mouth throughout feeding, stimulation needed to elicit sucking reflex. Audible swallowing: 1 - A few with stimulation Type of nipple: 2 - Everted at rest and after stimulation Comfort: 2 - Soft/non-tender Hold: 2 - No assistance needed to correctly position infant at breast LATCH score: 8 Latch assessment: Shallow Lips flanged: No Suck assessment: Displays both Tools: Nipple shield 20 mm, Supplemental nursing system (SNS) Pre-feed weight: 2658 grams Post feed weight: 2660 grams Amount transferred: 2 ml Amount supplemented: 23 ml with finger feeding with SNS  Additional Feeding Assessment:                                    Totals: Total amount transferred: 2 Total supplement given: 23 Total amount pumped post feed: 38  1. Offer infant the breast with feeding cues with no longer that 3 hours between feeds 2. If he is sleepy at the breast and not feeding actively, limit breast feeding to 20 minutes and then supplement with finger feeding with the SNS or bottle feeding 3. Use the # 20 Nipple shield with feedings as needed 4. Keep infant awake at the breast as needed with feeding by stimulating or burping him 5. Massage/compress breast with feedings 6. Pump both breasts with double electric breast pump after infant feeding for 15-20 minutes to protect supply  7. Koleen Nimrod needs 49 ml-65 ml (1.5-2 ounces) for 8 feedings a day or 390 ml-520 ml (13-17 ounces) in 24 hours 8. Supplement infant at the breast with breast milk SNS, increase  volumes as infant wants. Can use formula if out of breast milk 9. If infant does not take supplement at the breast, finger feed him or bottle feed him the remainder 10 Dr Theora Gianotti bottle with a level 1 nipple is a slower flow nipple for breast feeding infants, if he's choking or drooling a lot on the bottle, change to the Dr. Theora Gianotti Preemie nipple 11. Use the Paced bottle feeding method for bottle feeding ([email or web address]) 12. Keep up with good work !! 13. Call for assistance as needed [phone] 14. Thank you for allowing me to assist you today 15. Follow up with Lactation on Monday or Tuesday   Ed Blalock RN, [employer]  Silas Flood Ademola Vert 01/21/2018, 1:46 PM

## 2018-01-24 ENCOUNTER — Ambulatory Visit: Payer: Self-pay

## 2018-01-24 NOTE — Lactation Note (Signed)
This note was copied from a baby's chart.  01/24/2018  Name: Michelle Bridges MRN: [id number] Date of Birth: 01/17/2016 Gestational Age: Gestational Age: [redacted]w[redacted]d Birth Weight: 102.3 oz Weight today:   6 pounds 1.7 ounces (2768 grams) with clean newborn diaper  Michelle Bridges has gained 110 grams in the last 3 days with an average daily weight gain of 37 grams a day.   Mom presents today with Michelle Bridges for follow up feeding assessment. Michelle Bridges is an early term infant who is now corrected adjusted age of 38 weeks and 1 day.   Mom reports infant is feeding better. He is more sleepy at the breast than with the bottle. She is offering a bottle post BF and he is taking about 2 ounces from the Dr. Theora Gianotti Level 1 nipple. Mom denies choking or drooling on the bottle. Mom is not using the SNS at the breast. She is needing to use the Nipple Shield with each feeding. She has tried to feed without the NS and infant is not able to sustain latch. Infant sometimes self awakens to feed and sometimes mom needs to awaken him.   Reviewed trying to BF each day without the NS and gave her a # 24 NS to change sizes if this one becomes tight.   Infant is frantic at the breast at times, reviewed giving a little milk in the bottle prior to latch and/or priming the NS with breast milk using a curved tip syringe.   Mom is pumping with Symphony pump post BF and obtaining 2-5 ounces of breast milk per pumping. She is having to use formula sometimes if she has not pumped for the feeding.    Mom latched infant to the left breast in the cross cradle hold with the # 20 NS, infant fed actively and was noted to have swallows. Infant fed off and on for about 15 minutes, He transferred 34 ml. Mom did need to use awakening techniques at times during the feeding, infant responded well. Mom independent with feeding infant. Infant burped and relatched to the same breast, he fed for about 2 minutes, was burped and then latched to the right breast  in the cross cradle hold, infant latched with active feeding for about 15 minutes and transferred 28 ml. Mom reports this is a much better feeding than he usually does at home.    Infant to follow up with Ped on Wednesday. [facility] Nurse is scheduled to come out on Friday. Mom has a WIC appt in the next few weeks, she is unable to remember date/time. Infant to follow up with Lactation in 2 weeks to evaluate transfer and to start weaning of the pumping and hopefully the NS.     Mom reports she is getting better sleep. She has good support with dad and her family. Mom reports all questions/concerns have been answered.     General Information: Mother's reason for visit: follow up feeding assessment Consult: Follow-up Lactation consultant: Noralee Stain RN,IBCLC Breastfeeding experience: eating better, still sleepy at the breast   Maternal medications: Pre-natal vitamin, Stool softener, Motrin (ibuprofen)  Breastfeeding History: Frequency of breast feeding: every 2 hours Duration of feeding: 20-25 minutes  Supplementation: Supplement method: bottle(Dr. Brown's Level 1 nipple) Brand: Daron Offer Formula volume: 60 ml Formula frequency: 1 x at night Total formula volume per day: 60 ml Breast milk volume: 60 ml Breast milk frequency: 6 x a day  Total breast milk volume per day: 360 ml Pump type: Symphony Pump  frequency: 8 x a day Pump volume: 60-150 ml  Infant Output Assessment: Voids per 24 hours: 14   Stools per 24 hours: 8+ Stool color: Yellow  Breast Assessment: Breast: Soft Nipple: Erect Pain level: 0 Pain interventions: Bra, Other(nipple cream)  Feeding Assessment: Infant oral assessment: WNL   Positioning: Cross cradle(left breast) Latch: 2 - Grasps breast easily, tongue down, lips flanged, rhythmical sucking. Audible swallowing: 2 - Spontaneous and intermittent Type of nipple: 2 - Everted at rest and after stimulation Comfort: 2 - Soft/non-tender Hold:  2 - No assistance needed to correctly position infant at breast LATCH score: 10 Latch assessment: Deep Lips flanged: Yes Suck assessment: Displays both Tools: Nipple shield 20 mm Pre-feed weight: 2768 grams Post feed weight: 2802 grams Amount transferred: 34 ml Amount supplemented: 0  Additional Feeding Assessment: Infant oral assessment: WNL   Positioning: Cross cradle(left then right breast) Latch: 2 - Grasps breast easily, tongue down, lips flanged, rhythmical sucking. Audible swallowing: 2 - Spontaneous and intermittent Type of nipple: 2 - Everted at rest and after stimulation Comfort: 2 - Soft/non-tender Hold: 2 - No assistance needed to correctly position infant at breast LATCH score: 10 Latch assessment: Deep Lips flanged: Yes Suck assessment: Displays both Tools: Nipple shield 20 mm Pre-feed weight: 2802 grams Post feed weight: 3828 grams Amount transferred: 26 ml Amount supplemented: 0  Totals: Total amount transferred: 60 ml Total supplement given: 0 Total amount pumped post feed: 0  1. Offer infant the breast with feeding cues with no longer that 3 hours between feeds at least until he is back at his birth weight.  2. Use the # 20 Nipple shield with feedings as needed, try each day to nurse without the Nipple shield either at the beginning of the feeing or half way through the feeding 3. Keep infant awake at the breast as needed with feeding by stimulating or burping him 4. Massage/compress breast with feedings 5. Empty one breast before offering second breast 6. Pump both breasts with double electric breast pump after infant feeding for 15-20 minutes to protect supply with goal of pumping 8 x a day 7. Michelle Bridges needs 51 ml-68 ml (1.75-2.25 ounces) for 8 feedings a day or 405 ml-540 ml (14-18 ounces) in 24 hours 8. Supplement infant at the breast with breast milk SNS if mom wants, increase volumes as infant wants. Can use formula if out of breast milk 9. Offer  infant bottle of pumped breast milk after breast feeding is he is still cueing to feed and is not supplemented at the breast 10. Offer 1/2 ounces in bottle prior to latch if infant frantic at the breast and then try to latch to the breast, or prime nipple shield with pumped breast milk with curved tip syringe  11. Use the Paced bottle feeding method for feeding infant ([email or web address]) 12. Keep up with good work !! 13. Call for assistance as needed [phone] 14. Thank you for allowing me to assist you today 15. Follow up with Lactation in 2 weeks   Ed Blalock RN, IBCLC                                                     Ed Blalock 01/24/2018, 3:31 PM

## 2018-02-14 ENCOUNTER — Encounter: Payer: Self-pay | Admitting: Obstetrics & Gynecology

## 2018-02-14 ENCOUNTER — Ambulatory Visit (INDEPENDENT_AMBULATORY_CARE_PROVIDER_SITE_OTHER): Payer: Medicaid Other | Admitting: Obstetrics & Gynecology

## 2018-02-14 ENCOUNTER — Other Ambulatory Visit: Payer: Self-pay

## 2018-02-14 DIAGNOSIS — Z1389 Encounter for screening for other disorder: Secondary | ICD-10-CM

## 2018-02-14 MED ORDER — NORETHINDRONE 0.35 MG PO TABS: 1.0000 | ORAL_TABLET | Freq: Every day | ORAL | 11 refills | Status: DC

## 2018-02-14 NOTE — Patient Instructions (Signed)
Oral Contraception Use Oral contraceptive pills (OCPs) are medicines taken to prevent pregnancy. OCPs work by preventing the ovaries from releasing eggs. The hormones in OCPs also cause the cervical mucus to thicken, preventing the sperm from entering the uterus. The hormones also cause the uterine lining to become thin, not allowing a fertilized egg to attach to the inside of the uterus. OCPs are highly effective when taken exactly as prescribed. However, OCPs do not prevent sexually transmitted diseases (STDs). Safe sex practices, such as using condoms along with an OCP, can help prevent STDs. Before taking OCPs, you may have a physical exam and Pap test. Your health care provider may also order blood tests if necessary. Your health care provider will make sure you are a good candidate for oral contraception. Discuss with your health care provider the possible side effects of the OCP you may be prescribed. When starting an OCP, it can take 2 to 3 months for the body to adjust to the changes in hormone levels in your body. How to take oral contraceptive pills Your health care provider may advise you on how to start taking the first cycle of OCPs. Otherwise, you can:  Start on day 1 of your menstrual period. You will not need any backup contraceptive protection with this start time.  Start on the first Sunday after your menstrual period or the day you get your prescription. In these cases, you will need to use backup contraceptive protection for the first week.  Start the pill at any time of your cycle. If you take the pill within 5 days of the start of your period, you are protected against pregnancy right away. In this case, you will not need a backup form of birth control. If you start at any other time of your menstrual cycle, you will need to use another form of birth control for 7 days. If your OCP is the type called a minipill, it will protect you from pregnancy after taking it for 2 days (48  hours).  After you have started taking OCPs:  If you forget to take 1 pill, take it as soon as you remember. Take the next pill at the regular time.  If you miss 2 or more pills, call your health care provider because different pills have different instructions for missed doses. Use backup birth control until your next menstrual period starts.  If you use a 28-day pack that contains inactive pills and you miss 1 of the last 7 pills (pills with no hormones), it will not matter. Throw away the rest of the non-hormone pills and start a new pill pack.  No matter which day you start the OCP, you will always start a new pack on that same day of the week. Have an extra pack of OCPs and a backup contraceptive method available in case you miss some pills or lose your OCP pack. Follow these instructions at home:  Do not smoke.  Always use a condom to protect against STDs. OCPs do not protect against STDs.  Use a calendar to mark your menstrual period days.  Read the information and directions that came with your OCP. Talk to your health care provider if you have questions. Contact a health care provider if:  You develop nausea and vomiting.  You have abnormal vaginal discharge or bleeding.  You develop a rash.  You miss your menstrual period.  You are losing your hair.  You need treatment for mood swings or depression.  You   get dizzy when taking the OCP.  You develop acne from taking the OCP.  You become pregnant. Get help right away if:  You develop chest pain.  You develop shortness of breath.  You have an uncontrolled or severe headache.  You develop numbness or slurred speech.  You develop visual problems.  You develop pain, redness, and swelling in the legs. This information is not intended to replace advice given to you by your health care provider. Make sure you discuss any questions you have with your health care provider. Document Released: 08/13/2011 Document  Revised: 01/30/2016 Document Reviewed: 02/12/2013 Elsevier Interactive Patient Education  2017 Elsevier Inc.  

## 2018-02-14 NOTE — Progress Notes (Signed)
[facility] W[facility].  C/o chills, pain 4/10 and fever x 3 days; it was 104 on Saturday. Feels better now    Subjective:     Michelle BurlingtonDaniela Bridges is a 27 y.o. female who presents for a postpartum visit. She is 4 weeks postpartum following a spontaneous vaginal delivery. I have fully reviewed the prenatal and intrapartum course. The delivery was at 37 gestational weeks. Outcome: spontaneous vaginal delivery. Anesthesia: none. Postpartum course has been Unremarkable. Baby's course has been Unremarable. Baby is feeding by breast. Bleeding staining only. Bowel function is abnormal: Constipated. Bladder function is normal. Patient is not sexually active. Contraception method is none. Postpartum depression screening: negative.  The following portions of the patient's history were reviewed and updated as appropriate: allergies, current medications, past family history, past medical history, past social history, past surgical history and problem list.  Review of Systems Pertinent items are noted in HPI.   Objective:    BP 119/75   Pulse 97   Temp 98.8 F (37.1 C)   Wt 108 lb (49 kg)   Breastfeeding? Yes   BMI 21.09 kg/m   General:  alert, cooperative and no distress   Breasts:     Lungs: clear to auscultation bilaterally  Heart:  regular rate and rhythm, S1, S2 normal, no murmur, click, rub or gallop  Abdomen: soft, non-tender; bowel sounds normal; no masses,  no organomegaly   Vulva:  not evaluated  Vagina: not evaluated                    Assessment:     6 week postpartum exam. Pap smear not done at today's visit. Viral syndrome with sx improving and appears well today  Plan:    1. Contraception: oral progesterone-only contraceptive 2. Report if fever or other sx of viral syndrome do not resolve 3. Follow up as needed.    Adam PhenixArnold, Michalle Rademaker G, MD 02/14/2018

## 2018-02-24 ENCOUNTER — Telehealth (HOSPITAL_COMMUNITY): Payer: Self-pay | Admitting: Lactation Services

## 2018-02-24 NOTE — Telephone Encounter (Signed)
Patient called. She says that for the last couple of days, after pumping, she has noted that her areola becomes red & they hurt w/a burning sensation (patient remarked that her nipples might turn red, also). Patient does not feel this during pumping.  Patient's description may be consistent with vasospasm (especially if she is pumping in an air-conditioned room). I suggested that she apply, a dry, warmed towel/blanket to her breasts after pumping. She denies an increase in her caffeine intake. I asked patient to call us back in a couple of days if no improvement.   Glenetta HewKim Jaydalynn Olivero, RN, IBCLC

## 2018-05-23 ENCOUNTER — Ambulatory Visit: Payer: Self-pay | Admitting: Physician Assistant

## 2018-05-23 ENCOUNTER — Other Ambulatory Visit: Payer: Self-pay

## 2018-05-23 ENCOUNTER — Encounter: Payer: Self-pay | Admitting: Physician Assistant

## 2018-05-23 VITALS — BP 100/62 | HR 60 | Temp 99.0°F | Resp 16 | Ht 60.5 in | Wt 110.8 lb

## 2018-05-23 DIAGNOSIS — L309 Dermatitis, unspecified: Secondary | ICD-10-CM

## 2018-05-23 MED ORDER — TRIAMCINOLONE ACETONIDE 0.5 % EX OINT: 1.0000 "application " | TOPICAL_OINTMENT | Freq: Two times a day (BID) | CUTANEOUS | 0 refills | Status: AC

## 2018-05-23 NOTE — Patient Instructions (Addendum)
Apply ointment twice daily for 2 weeks.  For better absorption, wrap your hand in plastic (occlusion therapy) to increase penetration of the ointment.    Follow these instructions at home:  Take over-the-counter and prescription medicines only as told by your health care provider.  Use creams or ointments to moisturize your skin. Do not use lotions.  Learn what triggers or irritates your symptoms. Avoid these things.  Treat symptom flare-ups quickly.  Do not itch your skin. This can make your rash worse.  Keep all follow-up visits as told by your health care provider. This is important. Where to find more information:  The American Academy of Dermatology: InfoExam.si  The National Eczema Association: www.nationaleczema.org  Eczema Eczema is a broad term for a group of skin conditions that cause skin to become rough and inflamed. Each type of eczema has different triggers, symptoms, and treatments. Eczema of any type is usually itchy and symptoms range from mild to severe. Eczema and its symptoms are not spread from person to person (are not contagious). It can appear on different parts of the body at different times. Your eczema may not look the same as someone else's eczema. What are the types of eczema? Atopic dermatitis This is a long-term (chronic) skin disease that keeps coming back (recurring). Usual symptoms are dry skin and small, solid pimples that may swell and leak fluid (weep). Contact dermatitis This happens when something irritates the skin and causes a rash. The irritation can come from substances that you are allergic to (allergens), such as poison ivy, chemicals, or medicines that were applied to your skin. Dyshidrotic eczema This is a form of eczema on the hands and feet. It shows up as very itchy, fluid-filled blisters. It can affect people of any age, but is more common before age 19. Hand eczema This causes very itchy areas of skin on the palms and sides of  the hands and fingers. This type of eczema is common in industrial jobs where you may be exposed to many different types of irritants. Lichen simplex chronicus This type of eczema occurs when a person constantly scratches one area of the body. Repeated scratching of the area leads to thickened skin (lichenification). Lichen simplex chronicus can occur along with other types of eczema. It is more common in adults, but may be seen in children as well. Nummular eczema This is a common type of eczema. It has no known cause. It typically causes a red, circular, crusty lesion (plaque) that may be itchy. Scratching may become a habit and can cause bleeding. Nummular eczema occurs most often in people of middle-age or older. It most often affects the hands. Seborrheic dermatitis This is a common skin disease that mainly affects the scalp. It may also affect any oily areas of the body, such as the face, sides of nose, eyebrows, ears, eyelids, and chest. It is marked by small scaling and redness of the skin (erythema). This can affect people of all ages. In infants, this condition is known as Location manager." Stasis dermatitis This is a common skin disease that usually appears on the legs and feet. It most often occurs in people who have a condition that prevents blood from being pumped through the veins in the legs (chronic venous insufficiency). Stasis dermatitis is a chronic condition that needs long-term management. How is eczema diagnosed? Your health care provider will examine your skin and review your medical history. He or she may also give you skin patch tests. These tests  involve taking patches that contain possible allergens and placing them on your back. He or she will then check in a few days to see if an allergic reaction occurred. What are the common treatments? Treatment for eczema is based on the type of eczema you have. Hydrocortisone steroid medicine can relieve itching quickly and help reduce  inflammation. This medicine may be prescribed or obtained over-the-counter, depending on the strength of the medicine that is needed. Contact a health care provider if:  You have serious itching, even with treatment.  You regularly scratch your skin until it bleeds.  Your rash looks different than usual.  Your skin is painful, swollen, or more red than usual.  You have a fever. Summary  There are eight general types of eczema. Each type has different triggers.  Eczema of any type causes itching that may range from mild to severe.  Treatment varies based on the type of eczema you have. Hydrocortisone steroid medicine can help with itching and inflammation.  Protecting your skin is the best way to prevent eczema. Use moisturizers and lotions. Avoid triggers and irritants, and treat flare-ups quickly. This information is not intended to replace advice given to you by your health care provider. Make sure you discuss any questions you have with your health care provider. Document Released: 01/07/2017 Document Revised: 01/07/2017 Document Reviewed: 01/07/2017 Elsevier Interactive Patient Education  2018 [employer]   IF you received an x-ray today, you will receive an invoice from [facility]. Please contact [facility] at [phone] with questions or concerns regarding your invoice.   IF you received labwork today, you will receive an invoice from [facility]. Please contact [facility] at [phone] with questions or concerns regarding your invoice.   Our billing staff will not be able to assist you with questions regarding bills from these companies.  You will be contacted with the lab results as soon as they are available. The fastest way to get your results is to activate your My Chart account. Instructions are located on the last page of this paperwork. If you have not heard from us regarding the results in 2 weeks, please contact this office.

## 2018-05-23 NOTE — Progress Notes (Signed)
   Michelle BurlingtonDaniela Bridges  MRN: [id number] DOB: Dec 04, 1990  PCP: Patient, No Pcp Per  Subjective:  Pt is a 27 year old female who presents to clinic for rash on her hands. She is here today with her mother.  Rash is on the top of her left hand. Endorses itching. "there are sometimes little blisters" She has tried otc hydrocortisone cream which helps, but doesn't make it go away.   Review of Systems  Patient Active Problem List   Diagnosis Date Noted  . Postpartum care following vaginal delivery 01/19/2018  . Vitamin D deficiency 08/26/2017    Current Outpatient Medications on File Prior to Visit  Medication Sig Dispense Refill  . acetaminophen (TYLENOL) 325 MG tablet Take 650 mg by mouth every 6 (six) hours as needed for mild pain or headache.    . ibuprofen (ADVIL,MOTRIN) 600 MG tablet Take 1 tablet (600 mg total) by mouth every 6 (six) hours. 30 tablet 0  . norethindrone (MICRONOR,CAMILA,ERRIN) 0.35 MG tablet Take 1 tablet (0.35 mg total) by mouth daily. 1 Package 11  . Prenatal Vit-Fe Fumarate-FA (PRENATAL MULTIVITAMIN) TABS tablet Take 1 tablet by mouth daily at 12 noon.    . Vitamin D, Ergocalciferol, (DRISDOL) 50000 units CAPS capsule Take 1 capsule (50,000 Units total) by mouth every 7 (seven) days. 30 capsule 2  . omeprazole (PRILOSEC) 20 MG capsule Take 1 capsule (20 mg total) by mouth 2 (two) times daily before a meal. (Patient not taking: Reported on 12/30/2017) 60 capsule 5  . ursodiol (ACTIGALL) 500 MG tablet Take 1 tablet (500 mg total) by mouth 2 (two) times daily. (Patient not taking: Reported on 05/23/2018) 60 tablet 3   No current facility-administered medications on file prior to visit.     No Known Allergies   Objective:  BP 100/62 (BP Location: Left Arm, Patient Position: Sitting, Cuff Size: Normal)   Pulse 60   Temp 99 F (37.2 C) (Oral)   Resp 16   Ht 5' 0.5" (1.537 m)   Wt 110 lb 12.8 oz (50.3 kg)   LMP 04/21/2018   SpO2 99%   BMI 21.28 kg/m    Physical Exam  Constitutional: She appears well-developed and well-nourished.  Skin: Rash noted. Rash is maculopapular.     Psychiatric: She has a normal mood and affect. Her behavior is normal. Judgment and thought content normal.  Vitals reviewed.     Assessment and Plan :  1. Eczema of hand - rash of dorsal hand consistent with eczema. Plan to treat with high potency topical corticosteroid x 2 weeks. RTC if no improvement.  - triamcinolone ointment (KENALOG) 0.5 %; Apply 1 application topically 2 (two) times daily for 14 days.  Dispense: 28 g; Refill: 0    Whitney Manasvini Whatley, PA-C  [facility] at [facility] 05/23/2018 10:13 AM  Please note: Portions of this report may have been transcribed using dragon voice recognition software. Every effort was made to ensure accuracy; however, inadvertent computerized transcription errors may be present.

## 2018-08-12 ENCOUNTER — Ambulatory Visit: Payer: Self-pay | Admitting: *Deleted

## 2018-08-12 NOTE — Telephone Encounter (Signed)
Patient is calling and states that she has a sore throat that hurts when she eats. She states she just took a test and found out she is pregnant so she is unsure what she can take. Please advise.  Reason for Disposition . [1] Sore throat is the only symptom AND [2] present > 48 hours  Answer Assessment - Initial Assessment Questions 1. ONSET: "When did the throat start hurting?" (Hours or days ago)      3 days ago 2. SEVERITY: "How bad is the sore throat?" (Scale 1-10; mild, moderate or severe)   - MILD (1-3):  doesn't interfere with eating or normal activities   - MODERATE (4-7): interferes with eating some solids and normal activities   - SEVERE (8-10):  excruciating pain, interferes with most normal activities   - SEVERE DYSPHAGIA: can't swallow liquids, drooling     moderate 3. STREP EXPOSURE: "Has there been any exposure to strep within the past week?" If so, ask: "What type of contact occurred?"      Father has been sick- has not been to the doctor 4.  VIRAL SYMPTOMS: "Are there any symptoms of a cold, such as a runny nose, cough, hoarse voice or red eyes?"      No- started with fever- sore bones- went away after 1 day, cough started today 5. FEVER: "Do you have a fever?" If so, ask: "What is your temperature, how was it measured, and when did it start?"     No fever now 6. PUS ON THE TONSILS: "Is there pus on the tonsils in the back of your throat?"     Red bumps in the back 7. OTHER SYMPTOMS: "Do you have any other symptoms?" (e.g., difficulty breathing, headache, rash)     no 8. PREGNANCY: "Is there any chance you are pregnant?" "When was your last menstrual period?"     Yes- positive test today - LMP - 07/13/18  Protocols used: SORE THROAT-A-AH

## 2018-08-13 ENCOUNTER — Encounter: Payer: Self-pay | Admitting: Family Medicine

## 2018-08-13 ENCOUNTER — Ambulatory Visit: Payer: Self-pay | Admitting: Family Medicine

## 2018-08-13 VITALS — BP 113/75 | HR 86 | Temp 97.9°F | Resp 12 | Ht 60.0 in | Wt 110.6 lb

## 2018-08-13 DIAGNOSIS — J029 Acute pharyngitis, unspecified: Secondary | ICD-10-CM

## 2018-08-13 LAB — POCT RAPID STREP A (OFFICE): RAPID STREP A SCREEN: NEGATIVE

## 2018-08-13 NOTE — Progress Notes (Signed)
Established Patient Office Visit  Subjective:  Patient ID: Michelle Bridges, female    DOB: 06/27/1991  Age: 27 y.o. MRN: [id number]  CC:  Chief Complaint  Patient presents with  . Sore Throat    bad sore throat x 4 day, cough x 1 day, had fever 4 days ago but no fever since 4 days ago    HPI Michelle Bridges presents for 4 days history of sore throat, fever and cough She also rhinorrhea The cough is nonproductive She has nausea She reports that she just pregnancy test that was positive. 07/13/2018 was her first day of her LMP    No past medical history on file.  Past Surgical History:  Procedure Laterality Date  . LASIK      Family History  Problem Relation Age of Onset  . Asthma Maternal Aunt   . Diabetes Maternal Aunt   . Diabetes Paternal Grandmother     Social History   Socioeconomic History  . Marital status: Single    Spouse name: Not on file  . Number of children: Not on file  . Years of education: Not on file  . Highest education level: Not on file  Occupational History  . Not on file  Social Needs  . Financial resource strain: Not on file  . Food insecurity:    Worry: Not on file    Inability: Not on file  . Transportation needs:    Medical: Not on file    Non-medical: Not on file  Tobacco Use  . Smoking status: Never Smoker  . Smokeless tobacco: Never Used  Substance and Sexual Activity  . Alcohol use: No  . Drug use: No  . Sexual activity: Yes    Birth control/protection: None  Lifestyle  . Physical activity:    Days per week: Not on file    Minutes per session: Not on file  . Stress: Not on file  Relationships  . Social connections:    Talks on phone: Not on file    Gets together: Not on file    Attends religious service: Not on file    Active member of club or organization: Not on file    Attends meetings of clubs or organizations: Not on file    Relationship status: Not on file  . Intimate partner violence:   Fear of current or ex partner: Not on file    Emotionally abused: Not on file    Physically abused: Not on file    Forced sexual activity: Not on file  Other Topics Concern  . Not on file  Social History Narrative  . Not on file    Outpatient Medications Prior to Visit  Medication Sig Dispense Refill  . Prenatal Vit-Fe Fumarate-FA (PRENATAL MULTIVITAMIN) TABS tablet Take 1 tablet by mouth daily at 12 noon.    Marland Kitchen acetaminophen (TYLENOL) 325 MG tablet Take 650 mg by mouth every 6 (six) hours as needed for mild pain or headache.    . ibuprofen (ADVIL,MOTRIN) 600 MG tablet Take 1 tablet (600 mg total) by mouth every 6 (six) hours. 30 tablet 0  . norethindrone (MICRONOR,CAMILA,ERRIN) 0.35 MG tablet Take 1 tablet (0.35 mg total) by mouth daily. 1 Package 11  . omeprazole (PRILOSEC) 20 MG capsule Take 1 capsule (20 mg total) by mouth 2 (two) times daily before a meal. (Patient not taking: Reported on 12/30/2017) 60 capsule 5  . ursodiol (ACTIGALL) 500 MG tablet Take 1 tablet (500 mg total) by mouth 2 (two)  times daily. (Patient not taking: Reported on 05/23/2018) 60 tablet 3  . Vitamin D, Ergocalciferol, (DRISDOL) 50000 units CAPS capsule Take 1 capsule (50,000 Units total) by mouth every 7 (seven) days. 30 capsule 2   No facility-administered medications prior to visit.     No Known Allergies  ROS Review of Systems Review of Systems  Constitutional: Negative for activity change, appetite change, chills and fever.  HENT: Negative for congestion, nosebleeds, trouble swallowing and voice change.   Respiratory: SEE HPI Gastrointestinal: Negative for diarrhea, nausea and vomiting.  Genitourinary: Negative for difficulty urinating, dysuria, flank pain and hematuria.  Musculoskeletal: Negative for back pain, joint swelling and neck pain.  Neurological: Negative for dizziness, speech difficulty, light-headedness and numbness.  See HPI. All other review of systems negative.      Objective:      Physical Exam  BP 113/75 (BP Location: Right Arm, Patient Position: Sitting, Cuff Size: Normal)   Pulse 86   Temp 97.9 F (36.6 C) (Oral)   Resp 12   Ht 5' (1.524 m)   Wt 110 lb 9.6 oz (50.2 kg)   SpO2 96%   BMI 21.60 kg/m  Wt Readings from Last 3 Encounters:  08/13/18 110 lb 9.6 oz (50.2 kg)  05/23/18 110 lb 12.8 oz (50.3 kg)  02/14/18 108 lb (49 kg)   General: alert, oriented, in NAD Head: normocephalic, atraumatic, no sinus tenderness Eyes: EOM intact, no scleral icterus or conjunctival injection Ears: TM clear bilaterally Nose: mucosa nonerythematous, nonedematous Throat: no pharyngeal exudate or erythema Lymph: no posterior auricular, submental or cervical lymph adenopathy Heart: normal rate, normal sinus rhythm, no murmurs Lungs: clear to auscultation bilaterally, no wheezing    There are no preventive care reminders to display for this patient.  There are no preventive care reminders to display for this patient.  No results found for: TSH Lab Results  Component Value Date   WBC 11.4 (H) 01/18/2018   HGB 9.7 (L) 01/18/2018   HCT 28.5 (L) 01/18/2018   MCV 91.3 01/18/2018   PLT 186 01/18/2018   Lab Results  Component Value Date   NA 141 11/15/2017   K 4.2 11/15/2017   CO2 19 (L) 11/15/2017   GLUCOSE 73 11/15/2017   BUN 8 11/15/2017   CREATININE 0.53 (L) 11/15/2017   BILITOT <0.2 11/15/2017   ALKPHOS 114 11/15/2017   AST 26 11/15/2017   ALT 20 11/15/2017   PROT 6.5 11/15/2017   ALBUMIN 3.6 11/15/2017   CALCIUM 9.3 11/15/2017   ANIONGAP 8 07/16/2015   No results found for: CHOL No results found for: HDL No results found for: LDLCALC No results found for: TRIG No results found for: CHOLHDL Lab Results  Component Value Date   HGBA1C 5.1 08/23/2017      Assessment & Plan:   Problem List Items Addressed This Visit    None    Visit Diagnoses    Sorethroat    -  Primary   Relevant Orders   POCT rapid strep A (Completed)     LIKELY VIRAL   RAPID STREP NEGATIVE FOLLOW UP WITH PERINATALOGY FOR PRENATAL CARE SUPPORTIVE CARE WITH TYLENOL, NASAL SALINE, LOZENGES   No orders of the defined types were placed in this encounter.   Follow-up: No follow-ups on file.    Doristine BosworthZoe A Stallings, MD

## 2018-08-13 NOTE — Patient Instructions (Addendum)
     If you have lab work done today you will be contacted with your lab results within the next 2 weeks.  If you have not heard from us then please contact us. The fastest way to get your results is to register for My Chart.   IF you received an x-ray today, you will receive an invoice from [facility]. Please contact  Radiology at [phone] with questions or concerns regarding your invoice.   IF you received labwork today, you will receive an invoice from [facility]. Please contact [facility] at [phone] with questions or concerns regarding your invoice.   Our billing staff will not be able to assist you with questions regarding bills from these companies.  You will be contacted with the lab results as soon as they are available. The fastest way to get your results is to activate your My Chart account. Instructions are located on the last page of this paperwork. If you have not heard from us regarding the results in 2 weeks, please contact this office.     Sore Throat A sore throat is pain, burning, irritation, or scratchiness in the throat. When you have a sore throat, you may feel pain or tenderness in your throat when you swallow or talk. Many things can cause a sore throat, including:  An infection.  Seasonal allergies.  Dryness in the air.  Irritants, such as smoke or pollution.  Gastroesophageal reflux disease (GERD).  A tumor.  A sore throat is often the first sign of another sickness. It may happen with other symptoms, such as coughing, sneezing, fever, and swollen neck glands. Most sore throats go away without medical treatment. Follow these instructions at home:  Take over-the-counter medicines only as told by your health care provider.  Drink enough fluids to keep your urine clear or pale yellow.  Rest as needed.  To help with pain, try: ? Sipping warm liquids, such as broth, herbal tea, or warm water. ? Eating or drinking cold or frozen  liquids, such as frozen ice pops. ? Gargling with a salt-water mixture 3-4 times a day or as needed. To make a salt-water mixture, completely dissolve -1 tsp of salt in 1 cup of warm water. ? Sucking on hard candy or throat lozenges. ? Putting a cool-mist humidifier in your bedroom at night to moisten the air. ? Sitting in the bathroom with the door closed for 5-10 minutes while you run hot water in the shower.  Do not use any tobacco products, such as cigarettes, chewing tobacco, and e-cigarettes. If you need help quitting, ask your health care provider. Contact a health care provider if:  You have a fever for more than 2-3 days.  You have symptoms that last (are persistent) for more than 2-3 days.  Your throat does not get better within 7 days.  You have a fever and your symptoms suddenly get worse. Get help right away if:  You have difficulty breathing.  You cannot swallow fluids, soft foods, or your saliva.  You have increased swelling in your throat or neck.  You have persistent nausea and vomiting. This information is not intended to replace advice given to you by your health care provider. Make sure you discuss any questions you have with your health care provider. Document Released: 10/01/2004 Document Revised: 04/19/2016 Document Reviewed: 06/14/2015 Elsevier Interactive Patient Education  2018 [employer]vier Inc.  

## 2018-08-16 ENCOUNTER — Inpatient Hospital Stay (HOSPITAL_COMMUNITY)
Admission: AD | Admit: 2018-08-16 | Discharge: 2018-08-16 | Disposition: A | Discharge status: Home / Self Care | Payer: Medicaid Other | Attending: Obstetrics & Gynecology | Admitting: Obstetrics & Gynecology

## 2018-08-16 ENCOUNTER — Encounter (HOSPITAL_COMMUNITY): Payer: Self-pay | Admitting: *Deleted

## 2018-08-16 DIAGNOSIS — N939 Abnormal uterine and vaginal bleeding, unspecified: Secondary | ICD-10-CM | POA: Insufficient documentation

## 2018-08-16 DIAGNOSIS — Z3202 Encounter for pregnancy test, result negative: Secondary | ICD-10-CM | POA: Insufficient documentation

## 2018-08-16 LAB — URINALYSIS, ROUTINE W REFLEX MICROSCOPIC

## 2018-08-16 LAB — HCG, QUANTITATIVE, PREGNANCY: hCG, Beta Chain, Quant, S: 3 m[IU]/mL (ref <5)

## 2018-08-16 LAB — POCT PREGNANCY, URINE: Preg Test, Ur: NEGATIVE

## 2018-08-16 LAB — URINALYSIS, MICROSCOPIC (REFLEX)
RBC / HPF: >50 RBC/hpf (ref 0–5)
SQUAMOUS EPITHELIAL / LPF: NONE SEEN (ref 0–5)

## 2018-08-16 MED ORDER — IBUPROFEN 600 MG PO TABS: 600.0000 mg | ORAL_TABLET | Freq: Four times a day (QID) | ORAL | 0 refills | Status: DC | PRN

## 2018-08-16 NOTE — MAU Note (Signed)
Pt C/O bleeding & cramping since yesterday, had pos HPT last Friday.  Pt has sore throat & coughing, seen at [facility] for [facility] on Saturday.

## 2018-08-16 NOTE — MAU Provider Note (Signed)
History     CSN: [id number]  Arrival date and time: 08/16/18 1408   First Provider Initiated Contact with Patient 08/16/18 1523      Chief Complaint  Patient presents with  . Abdominal Pain  . Vaginal Bleeding   HPI   Ms.Marland Kitchen.Marland Kitchen.Michelle Bridges is a 27 y.o. female here with spotting and pain. She had a positive pregnancy test on Friday. The spotting started yesterday and today the bleeding was heavier. Her last period was 11/6; this was a normal period for her.  The bleeding that she is having is similar to her menstrual cycle. She currently rates her lower abdominal pain 4/10. She has not taken any medication for it. This around the time her menstrual cycle would start.   OB History    Gravida  1   Para  1   Term  1   Preterm      AB      Living  1     SAB      TAB      Ectopic      Multiple  0   Live Births  1           History reviewed. No pertinent past medical history.  Past Surgical History:  Procedure Laterality Date  . LASIK      Family History  Problem Relation Age of Onset  . Asthma Maternal Aunt   . Diabetes Maternal Aunt   . Diabetes Paternal Grandmother     Social History   Tobacco Use  . Smoking status: Never Smoker  . Smokeless tobacco: Never Used  Substance Use Topics  . Alcohol use: No  . Drug use: No    Allergies: No Known Allergies  Medications Prior to Admission  Medication Sig Dispense Refill Last Dose  . Prenatal Vit-Fe Fumarate-FA (PRENATAL MULTIVITAMIN) TABS tablet Take 1 tablet by mouth daily at 12 noon.   Taking   Results for orders placed or performed during the hospital encounter of 08/16/18 (from the past 48 hour(s))  Urinalysis, Routine w reflex microscopic     Status: Abnormal   Collection Time: 08/16/18  3:05 PM  Result Value Ref Range   Color, Urine RED (A) YELLOW    Comment: BIOCHEMICALS MAY BE AFFECTED BY COLOR   APPearance HAZY (A) CLEAR   Specific Gravity, Urine  1.005 - 1.030    TEST NOT  REPORTED DUE TO COLOR INTERFERENCE OF URINE PIGMENT   pH  5.0 - 8.0    TEST NOT REPORTED DUE TO COLOR INTERFERENCE OF URINE PIGMENT   Glucose, UA (A) NEGATIVE mg/dL    TEST NOT REPORTED DUE TO COLOR INTERFERENCE OF URINE PIGMENT   Hgb urine dipstick (A) NEGATIVE    TEST NOT REPORTED DUE TO COLOR INTERFERENCE OF URINE PIGMENT   Bilirubin Urine (A) NEGATIVE    TEST NOT REPORTED DUE TO COLOR INTERFERENCE OF URINE PIGMENT   Ketones, ur (A) NEGATIVE mg/dL    TEST NOT REPORTED DUE TO COLOR INTERFERENCE OF URINE PIGMENT   Protein, ur (A) NEGATIVE mg/dL    TEST NOT REPORTED DUE TO COLOR INTERFERENCE OF URINE PIGMENT   Nitrite (A) NEGATIVE    TEST NOT REPORTED DUE TO COLOR INTERFERENCE OF URINE PIGMENT   Leukocytes, UA (A) NEGATIVE    TEST NOT REPORTED DUE TO COLOR INTERFERENCE OF URINE PIGMENT    Comment: Performed at [facility], [address] [place], [place] [id number]  Urinalysis, Microscopic (reflex)     Status:  Abnormal   Collection Time: 08/16/18  3:05 PM  Result Value Ref Range   RBC / HPF >50 0 - 5 RBC/hpf   WBC, UA 0-5 0 - 5 WBC/hpf   Bacteria, UA RARE (A) NONE SEEN   Squamous Epithelial / LPF NONE SEEN 0 - 5    Comment: Performed at [facility], [address], [place], [place] [address]  Pregnancy, urine POC     Status: None   Collection Time: 08/16/18  3:09 PM  Result Value Ref Range   Preg Test, Ur NEGATIVE NEGATIVE    Comment:        THE SENSITIVITY OF THIS METHODOLOGY IS >24 mIU/mL   hCG, quantitative, pregnancy     Status: None   Collection Time: 08/16/18  3:30 PM  Result Value Ref Range   hCG, Beta Chain, Quant, S 3 <5 mIU/mL    Comment:          GEST. AGE      CONC.  (mIU/mL)   <=1 WEEK        5 - 50     2 WEEKS       50 - 500     3 WEEKS       100 - 10,000     4 WEEKS     1,000 - 30,000     5 WEEKS     3,500 - 115,000   6-8 WEEKS     12,000 - 270,000    12 WEEKS     15,000 - 220,000        FEMALE AND NON-PREGNANT FEMALE:     LESS THAN 5  mIU/mL Performed at [facility], [address], [place], [place] [address]    Review of Systems  Gastrointestinal: Positive for abdominal pain (Occasional cramping ).  Genitourinary: Positive for vaginal bleeding.  Neurological: Negative for dizziness.   Physical Exam   Blood pressure 116/80, pulse 74, temperature 98.3 F (36.8 C), temperature source Oral, resp. rate 16, weight 51.3 kg, last menstrual period 07/13/2018, currently breastfeeding.  Physical Exam  Constitutional: She is oriented to person, place, and time. She appears well-developed and well-nourished. No distress.  HENT:  Head: Normocephalic.  GI: Soft. She exhibits no distension and no mass. There is no tenderness. There is no rebound and no guarding.  Musculoskeletal: Normal range of motion.  Neurological: She is alert and oriented to person, place, and time.  Skin: Skin is warm. She is not diaphoretic.  Psychiatric: Her behavior is normal.   MAU Course  Procedures  None  MDM  A positive blood type  Urine pregnancy test negative  Hcg negative today  Assessment and Plan   A:  1. Pregnancy examination or test, negative result     P:  Quant today negative Bleeding today likely 2/2 to her menstrual cycle  Rx: ibuprofen  Venia Carbon I, NP 08/16/2018 4:28 PM

## 2019-04-04 ENCOUNTER — Telehealth: Payer: Medicaid Other | Admitting: Nurse Practitioner

## 2019-04-04 DIAGNOSIS — Z20822 Contact with and (suspected) exposure to covid-19: Secondary | ICD-10-CM

## 2019-04-04 MED ORDER — BENZONATATE 100 MG PO CAPS: 100.0000 mg | ORAL_CAPSULE | Freq: Three times a day (TID) | ORAL | 0 refills | Status: DC | PRN

## 2019-04-04 NOTE — Progress Notes (Signed)
E-Visit for Corona Virus Screening   Your current symptoms could be consistent with the coronavirus.  Many health care providers can now test patients at their office but not all are.  [facility] has multiple testing sites. For information on our COVID testing locations and hours go to [email or web address]  Please quarantine yourself while awaiting your test results.  We are enrolling you in our MyChart Home Montioring for COVID19 . Daily you will receive a questionnaire within the MyChart website. Our COVID 19 response team willl be monitoriing your responses daily.  * you no longer need an order or an appointment to be corona tested. Just open the web site above and you will be able to see the locations and times for testing.   COVID-19 is a respiratory illness with symptoms that are similar to the flu. Symptoms are typically mild to moderate, but there have been cases of severe illness and death due to the virus. The following symptoms may appear 2-14 days after exposure: . Fever . Cough . Shortness of breath or difficulty breathing . Chills . Repeated shaking with chills . Muscle pain . Headache . Sore throat . New loss of taste or smell . Fatigue . Congestion or runny nose . Nausea or vomiting . Diarrhea  It is vitally important that if you feel that you have an infection such as this virus or any other virus that you stay home and away from places where you may spread it to others.  You should self-quarantine for 14 days if you have symptoms that could potentially be coronavirus or have been in close contact a with a person diagnosed with COVID-19 within the last 2 weeks. You should avoid contact with people age 65 and older.   You should wear a mask or cloth face covering over your nose and mouth if you must be around other people or animals, including pets (even at home). Try to stay at least 6 feet away from other people. This will protect the people  around you.  You can use medication such as A prescription cough medication called Tessalon Perles 100 mg. You may take 1-2 capsules every 8 hours as needed for cough  You may also take acetaminophen (Tylenol) as needed for fever.   Reduce your risk of any infection by using the same precautions used for avoiding the common cold or flu:  . Wash your hands often with soap and warm water for at least 20 seconds.  If soap and water are not readily available, use an alcohol-based hand sanitizer with at least 60% alcohol.  . If coughing or sneezing, cover your mouth and nose by coughing or sneezing into the elbow areas of your shirt or coat, into a tissue or into your sleeve (not your hands). . Avoid shaking hands with others and consider head nods or verbal greetings only. . Avoid touching your eyes, nose, or mouth with unwashed hands.  . Avoid close contact with people who are sick. . Avoid places or events with large numbers of people in one location, like concerts or sporting events. . Carefully consider travel plans you have or are making. . If you are planning any travel outside or inside the [place], visit the CDC's Travelers' Health webpage for the latest health notices. . If you have some symptoms but not all symptoms, continue to monitor at home and seek medical attention if your symptoms worsen. . If you are having a medical emergency, call 911.  HOME CARE .   Only take medications as instructed by your medical team. . Drink plenty of fluids and get plenty of rest. . A steam or ultrasonic humidifier can help if you have congestion.   GET HELP RIGHT AWAY IF YOU HAVE EMERGENCY WARNING SIGNS** FOR COVID-19. If you or someone is showing any of these signs seek emergency medical care immediately. Call 911 or proceed to your closest emergency facility if: . You develop worsening high fever. . Trouble breathing . Bluish lips or face . Persistent pain or pressure in the chest . New  confusion . Inability to wake or stay awake . You cough up blood. . Your symptoms become more severe  **This list is not all possible symptoms. Contact your medical provider for any symptoms that are sever or concerning to you.   MAKE SURE YOU   Understand these instructions.  Will watch your condition.  Will get help right away if you are not doing well or get worse.  Your e-visit answers were reviewed by a board certified advanced clinical practitioner to complete your personal care plan.  Depending on the condition, your plan could have included both over the counter or prescription medications.  If there is a problem please reply once you have received a response from your provider.  Your safety is important to us.  If you have drug allergies check your prescription carefully.    You can use MyChart to ask questions about today's visit, request a non-urgent call back, or ask for a work or school excuse for 24 hours related to this e-Visit. If it has been greater than 24 hours you will need to follow up with your provider, or enter a new e-Visit to address those concerns. You will get an e-mail in the next two days asking about your experience.  I hope that your e-visit has been valuable and will speed your recovery. Thank you for using e-visits.   5-10 minutes spent reviewing and documenting in chart.  

## 2019-04-05 ENCOUNTER — Other Ambulatory Visit: Payer: Self-pay

## 2019-04-05 DIAGNOSIS — Z20822 Contact with and (suspected) exposure to covid-19: Secondary | ICD-10-CM

## 2019-04-06 ENCOUNTER — Encounter (INDEPENDENT_AMBULATORY_CARE_PROVIDER_SITE_OTHER): Payer: Self-pay

## 2019-04-07 ENCOUNTER — Encounter (INDEPENDENT_AMBULATORY_CARE_PROVIDER_SITE_OTHER): Payer: Self-pay

## 2019-04-07 LAB — NOVEL CORONAVIRUS, NAA: SARS-CoV-2, NAA: NOT DETECTED

## 2019-04-08 ENCOUNTER — Encounter (INDEPENDENT_AMBULATORY_CARE_PROVIDER_SITE_OTHER): Payer: Self-pay

## 2019-04-09 ENCOUNTER — Encounter (INDEPENDENT_AMBULATORY_CARE_PROVIDER_SITE_OTHER): Payer: Self-pay

## 2019-04-13 ENCOUNTER — Encounter (INDEPENDENT_AMBULATORY_CARE_PROVIDER_SITE_OTHER): Payer: Self-pay

## 2019-04-20 ENCOUNTER — Ambulatory Visit (INDEPENDENT_AMBULATORY_CARE_PROVIDER_SITE_OTHER): Payer: Medicaid Other | Admitting: Obstetrics & Gynecology

## 2019-04-20 ENCOUNTER — Encounter: Payer: Self-pay | Admitting: Family Medicine

## 2019-04-20 ENCOUNTER — Other Ambulatory Visit: Payer: Self-pay | Admitting: Obstetrics & Gynecology

## 2019-04-20 ENCOUNTER — Other Ambulatory Visit: Payer: Self-pay

## 2019-04-20 ENCOUNTER — Ambulatory Visit (INDEPENDENT_AMBULATORY_CARE_PROVIDER_SITE_OTHER): Payer: Medicaid Other

## 2019-04-20 ENCOUNTER — Ambulatory Visit (HOSPITAL_COMMUNITY)
Admission: RE | Admit: 2019-04-20 | Discharge: 2019-04-20 | Disposition: A | Discharge status: Home / Self Care | Payer: Medicaid Other | Source: Ambulatory Visit | Attending: Obstetrics & Gynecology | Admitting: Obstetrics & Gynecology

## 2019-04-20 ENCOUNTER — Encounter: Payer: Self-pay | Admitting: Obstetrics & Gynecology

## 2019-04-20 DIAGNOSIS — Z3A12 12 weeks gestation of pregnancy: Secondary | ICD-10-CM

## 2019-04-20 DIAGNOSIS — Z3480 Encounter for supervision of other normal pregnancy, unspecified trimester: Secondary | ICD-10-CM

## 2019-04-20 DIAGNOSIS — Z3687 Encounter for antenatal screening for uncertain dates: Secondary | ICD-10-CM

## 2019-04-20 DIAGNOSIS — Z3481 Encounter for supervision of other normal pregnancy, first trimester: Secondary | ICD-10-CM

## 2019-04-20 NOTE — Progress Notes (Signed)
Pt here today for OB US results due to unsure of LMP.  Pt states that she had an appt at [facility] this am and they were unable to find baby FHR. Pt denies any pain or bleeding.  Pt informed that her EDD is 11/04/19 with 11w 5d and FHR 161 bpm.  Notified Dr. Kennon Rounds [place] results.  Proof of pregnancy letter provided by the front office.    Mel Almond, RN 04/20/19

## 2019-04-20 NOTE — Progress Notes (Addendum)
   PRENATAL VISIT NOTE  Subjective:  Michelle Bridges is a 28 y.o. G2P1001 at [redacted]w[redacted]d being seen today for first prenatal visit.  She is currently monitored for the following issues for this high-risk pregnancy and has Vitamin D deficiency; Postpartum care following vaginal delivery; and Supervision of other normal pregnancy, antepartum on their problem list.  Patient reports no complaints.  Contractions: Not present. Vag. Bleeding: None.   . Denies leaking of fluid.   The following portions of the patient's history were reviewed and updated as appropriate: allergies, current medications, past family history, past medical history, past social history, past surgical history and problem list.   Objective:   Vitals:   04/20/19 0907  BP: 101/68  Pulse: 84  Weight: 113 lb 9.6 oz (51.5 kg)    Fetal Status:           General:  Alert, oriented and cooperative. Patient is in no acute distress.  Skin: Skin is warm and dry. No rash noted.   Cardiovascular: Normal heart rate noted  Respiratory: Normal respiratory effort, no problems with respiration noted  Abdomen: Soft, gravid, appropriate for gestational age.  Pain/Pressure: Absent     Pelvic: Cervical exam deferred        Extremities: Normal range of motion.  Edema: None  Mental Status: Normal mood and affect. Normal behavior. Normal judgment and thought content.   Assessment and Plan:  Pregnancy: G2P1001 at [redacted]w[redacted]d 1. Supervision of other normal pregnancy, antepartum S<D on [place] at bedside, no fetal pole seen, gestational sac seen  - US OB LESS THAN 14 WEEKS WITH OB TRANSVAGINAL; Future Suspect failed pregnancy general obstetric precautions including but not limited to vaginal bleeding,leaking of fluid  Please refer to After Visit Summary for other counseling recommendations.   Return if symptoms worsen or fail to improve. We will discuss [place] result when available and f/u No future appointments.  Emeterio Reeve, MD  Ultrasound f/u  done today and showed viable IUP 11.5 weeks, will reschedule her NOB 1:52 PM 04/20/2019

## 2019-04-20 NOTE — Progress Notes (Signed)
Pt is here for initial OB visit. LMP 01/24/19. EDD 10/31/2019. Pt reports she was diagnosed with cholestasis in first pregnancy, but she states that her symptoms never went away after pregnancy and she has since been diagnosed with eczema.

## 2019-04-20 NOTE — Progress Notes (Signed)
Patient seen and assessed by nursing staff.  Agree with documentation and plan.  

## 2019-04-20 NOTE — Patient Instructions (Signed)
First Trimester of Pregnancy The first trimester of pregnancy is from week 1 until the end of week 13 (months 1 through 3). A week after a sperm fertilizes an egg, the egg will implant on the wall of the uterus. This embryo will begin to develop into a baby. Genes from you and your partner will form the baby. The female genes will determine whether the baby will be a boy or a girl. At 6-8 weeks, the eyes and face will be formed, and the heartbeat can be seen on ultrasound. At the end of 12 weeks, all the baby's organs will be formed. Now that you are pregnant, you will want to do everything you can to have a healthy baby. Two of the most important things are to get good prenatal care and to follow your health care provider's instructions. Prenatal care is all the medical care you receive before the baby's birth. This care will help prevent, find, and treat any problems during the pregnancy and childbirth. Body changes during your first trimester Your body goes through many changes during pregnancy. The changes vary from woman to woman.  You may gain or lose a couple of pounds at first.  You may feel sick to your stomach (nauseous) and you may throw up (vomit). If the vomiting is uncontrollable, call your health care provider.  You may tire easily.  You may develop headaches that can be relieved by medicines. All medicines should be approved by your health care provider.  You may urinate more often. Painful urination may mean you have a bladder infection.  You may develop heartburn as a result of your pregnancy.  You may develop constipation because certain hormones are causing the muscles that push stool through your intestines to slow down.  You may develop hemorrhoids or swollen veins (varicose veins).  Your breasts may begin to grow larger and become tender. Your nipples may stick out more, and the tissue that surrounds them (areola) may become darker.  Your gums may bleed and may be  sensitive to brushing and flossing.  Dark spots or blotches (chloasma, mask of pregnancy) may develop on your face. This will likely fade after the baby is born.  Your menstrual periods will stop.  You may have a loss of appetite.  You may develop cravings for certain kinds of food.  You may have changes in your emotions from day to day, such as being excited to be pregnant or being concerned that something may go wrong with the pregnancy and baby.  You may have more vivid and strange dreams.  You may have changes in your hair. These can include thickening of your hair, rapid growth, and changes in texture. Some women also have hair loss during or after pregnancy, or hair that feels dry or thin. Your hair will most likely return to normal after your baby is born. What to expect at prenatal visits During a routine prenatal visit:  You will be weighed to make sure you and the baby are growing normally.  Your blood pressure will be taken.  Your abdomen will be measured to track your baby's growth.  The fetal heartbeat will be listened to between weeks 10 and 14 of your pregnancy.  Test results from any previous visits will be discussed. Your health care provider may ask you:  How you are feeling.  If you are feeling the baby move.  If you have had any abnormal symptoms, such as leaking fluid, bleeding, severe headaches, or abdominal   cramping.  If you are using any tobacco products, including cigarettes, chewing tobacco, and electronic cigarettes.  If you have any questions. Other tests that may be performed during your first trimester include:  Blood tests to find your blood type and to check for the presence of any previous infections. The tests will also be used to check for low iron levels (anemia) and protein on red blood cells (Rh antibodies). Depending on your risk factors, or if you previously had diabetes during pregnancy, you may have tests to check for high blood sugar  that affects pregnant women (gestational diabetes).  Urine tests to check for infections, diabetes, or protein in the urine.  An ultrasound to confirm the proper growth and development of the baby.  Fetal screens for spinal cord problems (spina bifida) and Down syndrome.  HIV (human immunodeficiency virus) testing. Routine prenatal testing includes screening for HIV, unless you choose not to have this test.  You may need other tests to make sure you and the baby are doing well. Follow these instructions at home: Medicines  Follow your health care provider's instructions regarding medicine use. Specific medicines may be either safe or unsafe to take during pregnancy.  Take a prenatal vitamin that contains at least 600 micrograms (mcg) of folic acid.  If you develop constipation, try taking a stool softener if your health care provider approves. Eating and drinking   Eat a balanced diet that includes fresh fruits and vegetables, whole grains, good sources of protein such as meat, eggs, or tofu, and low-fat dairy. Your health care provider will help you determine the amount of weight gain that is right for you.  Avoid raw meat and uncooked cheese. These carry germs that can cause birth defects in the baby.  Eating four or five small meals rather than three large meals a day may help relieve nausea and vomiting. If you start to feel nauseous, eating a few soda crackers can be helpful. Drinking liquids between meals, instead of during meals, also seems to help ease nausea and vomiting.  Limit foods that are high in fat and processed sugars, such as fried and sweet foods.  To prevent constipation: ? Eat foods that are high in fiber, such as fresh fruits and vegetables, whole grains, and beans. ? Drink enough fluid to keep your urine clear or pale yellow. Activity  Exercise only as directed by your health care provider. Most women can continue their usual exercise routine during  pregnancy. Try to exercise for 30 minutes at least 5 days a week. Exercising will help you: ? Control your weight. ? Stay in shape. ? Be prepared for labor and delivery.  Experiencing pain or cramping in the lower abdomen or lower back is a good sign that you should stop exercising. Check with your health care provider before continuing with normal exercises.  Try to avoid standing for long periods of time. Move your legs often if you must stand in one place for a long time.  Avoid heavy lifting.  Wear low-heeled shoes and practice good posture.  You may continue to have sex unless your health care provider tells you not to. Relieving pain and discomfort  Wear a good support bra to relieve breast tenderness.  Take warm sitz baths to soothe any pain or discomfort caused by hemorrhoids. Use hemorrhoid cream if your health care provider approves.  Rest with your legs elevated if you have leg cramps or low back pain.  If you develop varicose veins in   your legs, wear support hose. Elevate your feet for 15 minutes, 3-4 times a day. Limit salt in your diet. Prenatal care  Schedule your prenatal visits by the twelfth week of pregnancy. They are usually scheduled monthly at first, then more often in the last 2 months before delivery.  Write down your questions. Take them to your prenatal visits.  Keep all your prenatal visits as told by your health care provider. This is important. Safety  Wear your seat belt at all times when driving.  Make a list of emergency phone numbers, including numbers for family, friends, the hospital, and police and fire departments. General instructions  Ask your health care provider for a referral to a local prenatal education class. Begin classes no later than the beginning of month 6 of your pregnancy.  Ask for help if you have counseling or nutritional needs during pregnancy. Your health care provider can offer advice or refer you to specialists for help  with various needs.  Do not use hot tubs, steam rooms, or saunas.  Do not douche or use tampons or scented sanitary pads.  Do not cross your legs for long periods of time.  Avoid cat litter boxes and soil used by cats. These carry germs that can cause birth defects in the baby and possibly loss of the fetus by miscarriage or stillbirth.  Avoid all smoking, herbs, alcohol, and medicines not prescribed by your health care provider. Chemicals in these products affect the formation and growth of the baby.  Do not use any products that contain nicotine or tobacco, such as cigarettes and e-cigarettes. If you need help quitting, ask your health care provider. You may receive counseling support and other resources to help you quit.  Schedule a dentist appointment. At home, brush your teeth with a soft toothbrush and be gentle when you floss. Contact a health care provider if:  You have dizziness.  You have mild pelvic cramps, pelvic pressure, or nagging pain in the abdominal area.  You have persistent nausea, vomiting, or diarrhea.  You have a bad smelling vaginal discharge.  You have pain when you urinate.  You notice increased swelling in your face, hands, legs, or ankles.  You are exposed to fifth disease or chickenpox.  You are exposed to German measles (rubella) and have never had it. Get help right away if:  You have a fever.  You are leaking fluid from your vagina.  You have spotting or bleeding from your vagina.  You have severe abdominal cramping or pain.  You have rapid weight gain or loss.  You vomit blood or material that looks like coffee grounds.  You develop a severe headache.  You have shortness of breath.  You have any kind of trauma, such as from a fall or a car accident. Summary  The first trimester of pregnancy is from week 1 until the end of week 13 (months 1 through 3).  Your body goes through many changes during pregnancy. The changes vary from  woman to woman.  You will have routine prenatal visits. During those visits, your health care provider will examine you, discuss any test results you may have, and talk with you about how you are feeling. This information is not intended to replace advice given to you by your health care provider. Make sure you discuss any questions you have with your health care provider. Document Released: 08/18/2001 Document Revised: 08/06/2017 Document Reviewed: 08/05/2016 Elsevier Patient Education  2020 [employer]  

## 2019-05-16 ENCOUNTER — Ambulatory Visit (INDEPENDENT_AMBULATORY_CARE_PROVIDER_SITE_OTHER): Payer: Medicaid Other | Admitting: Advanced Practice Midwife

## 2019-05-16 ENCOUNTER — Other Ambulatory Visit (HOSPITAL_COMMUNITY)
Admission: RE | Admit: 2019-05-16 | Discharge: 2019-05-16 | Disposition: A | Discharge status: Home / Self Care | Payer: Medicaid Other | Source: Ambulatory Visit | Attending: Advanced Practice Midwife | Admitting: Advanced Practice Midwife

## 2019-05-16 ENCOUNTER — Other Ambulatory Visit: Payer: Self-pay

## 2019-05-16 VITALS — BP 108/70 | HR 76 | Temp 98.0°F | Wt 113.6 lb

## 2019-05-16 DIAGNOSIS — Z3480 Encounter for supervision of other normal pregnancy, unspecified trimester: Secondary | ICD-10-CM | POA: Diagnosis not present

## 2019-05-16 DIAGNOSIS — O219 Vomiting of pregnancy, unspecified: Secondary | ICD-10-CM

## 2019-05-16 DIAGNOSIS — Z3A16 16 weeks gestation of pregnancy: Secondary | ICD-10-CM

## 2019-05-16 MED ORDER — BLOOD PRESSURE KIT DEVI: 1.0000 | 0 refills | Status: DC | PRN

## 2019-05-16 NOTE — Patient Instructions (Signed)

## 2019-05-16 NOTE — Addendum Note (Signed)
Addended by: Tristan Schroeder D on: 05/16/2019 03:25 PM   Modules accepted: Orders

## 2019-05-16 NOTE — Progress Notes (Signed)
Patient reports that she is not feeling fetal movement yet, denies pain today. Pt declined all genetic testing.

## 2019-05-16 NOTE — Progress Notes (Signed)
   PRENATAL VISIT NOTE  Subjective:  Michelle Bridges is a 28 y.o. G2P1001 at [redacted]w[redacted]d being seen today for ongoing prenatal care.  She is currently monitored for the following issues for this low-risk pregnancy and has Vitamin D deficiency; Postpartum care following vaginal delivery; and Supervision of other normal pregnancy, antepartum on their problem list.  Patient reports no complaints.  Contractions: Not present. Vag. Bleeding: None.   . Denies leaking of fluid.   The following portions of the patient's history were reviewed and updated as appropriate: allergies, current medications, past family history, past medical history, past social history, past surgical history and problem list. Problem list updated.  Objective:   Vitals:   05/16/19 1421  BP: 108/70  Pulse: 76  Temp: 98 F (36.7 C)  Weight: 113 lb 9.6 oz (51.5 kg)    Fetal Status: Fetal Heart Rate (bpm): 152         General:  Alert, oriented and cooperative. Patient is in no acute distress.  Skin: Skin is warm and dry. No rash noted.   Cardiovascular: Normal heart rate noted  Respiratory: Normal respiratory effort, no problems with respiration noted  Abdomen: Soft, gravid, appropriate for gestational age.  Pain/Pressure: Absent     Pelvic: Cervical exam deferred        Extremities: Normal range of motion.  Edema: None  Mental Status: Normal mood and affect. Normal behavior. Normal judgment and thought content.   Assessment and Plan:  Pregnancy: G2P1001 at [redacted]w[redacted]d  1. Supervision of other normal pregnancy, antepartum - No complaints or concerns, continue routine care - Reviewed milestones for visits, typical blend of virtual and in-person appts - Pt does not have cuff. Will order and have sent to [place] - Reviewed goal of weekly home blood pressure readings, call for elevated > 140/90 - [place] MFM OB COMP + 14 WK; Future - Obstetric Panel, Including HIV - Culture, OB Urine - Cervicovaginal ancillary only(  [place]) - Cytology - PAP( [place]) - Hemoglobinopathy evaluation  2. Nausea and vomiting during pregnancy prior to [redacted] weeks gestation - Resolving without intervention - Declines rx today  Preterm labor symptoms and general obstetric precautions including but not limited to vaginal bleeding, contractions, leaking of fluid and fetal movement were reviewed in detail with the patient. Please refer to After Visit Summary for other counseling recommendations.  Return in about 4 weeks (around 06/13/2019) for Virtual appt, any Provider.  Future Appointments  Date Time Provider [place]  06/06/2019  1:30 PM WH-MFC [place] 1 WH-MFCUS MFC-US  06/13/2019 10:00 AM Shelly Bombard, MD [place] None    Darlina Rumpf, [place]

## 2019-05-16 NOTE — Addendum Note (Signed)
Addended by: Tristan Schroeder D on: 05/16/2019 03:43 PM   Modules accepted: Orders

## 2019-05-17 LAB — OBSTETRIC PANEL, INCLUDING HIV
Antibody Screen: NEGATIVE
Basophils Absolute: 0 10*3/uL (ref 0.0–0.2)
Basos: 0 %
EOS (ABSOLUTE): 0.2 10*3/uL (ref 0.0–0.4)
Eos: 2 %
HIV Screen 4th Generation wRfx: NONREACTIVE
Hematocrit: 33.3 % — ABNORMAL LOW (ref 34.0–46.6)
Hemoglobin: 11.6 g/dL (ref 11.1–15.9)
Hepatitis B Surface Ag: NEGATIVE
Immature Grans (Abs): 0 10*3/uL (ref 0.0–0.1)
Immature Granulocytes: 0 %
Lymphocytes Absolute: 2.4 10*3/uL (ref 0.7–3.1)
Lymphs: 32 %
MCH: 30.1 pg (ref 26.6–33.0)
MCHC: 34.8 g/dL (ref 31.5–35.7)
MCV: 86 fL (ref 79–97)
Monocytes Absolute: 0.3 10*3/uL (ref 0.1–0.9)
Monocytes: 4 %
Neutrophils Absolute: 4.7 10*3/uL (ref 1.4–7.0)
Neutrophils: 62 %
Platelets: 269 10*3/uL (ref 150–450)
RBC: 3.86 x10E6/uL (ref 3.77–5.28)
RDW: 13.4 % (ref 11.7–15.4)
RPR Ser Ql: NONREACTIVE
Rh Factor: POSITIVE
Rubella Antibodies, IGG: 2.95 index (ref >0.99)
WBC: 7.7 10*3/uL (ref 3.4–10.8)

## 2019-05-17 LAB — CYTOLOGY - PAP: Diagnosis: NEGATIVE

## 2019-05-18 LAB — CULTURE, OB URINE

## 2019-05-18 LAB — CERVICOVAGINAL ANCILLARY ONLY
Bacterial vaginitis: NEGATIVE
Candida vaginitis: NEGATIVE
Chlamydia: NEGATIVE
Neisseria Gonorrhea: NEGATIVE
Trichomonas: NEGATIVE

## 2019-05-18 LAB — URINE CULTURE, OB REFLEX: Organism ID, Bacteria: NO GROWTH

## 2019-05-19 LAB — HEMOGLOBINOPATHY EVALUATION
HGB C: 0 %
HGB S: 0 %
HGB VARIANT: 0 %
Hemoglobin A2 Quantitation: 2.9 % (ref 1.8–3.2)
Hemoglobin F Quantitation: 0 % (ref 0.0–2.0)
Hgb A: 97.1 % (ref 96.4–98.8)

## 2019-06-06 ENCOUNTER — Other Ambulatory Visit: Payer: Self-pay

## 2019-06-06 ENCOUNTER — Ambulatory Visit (HOSPITAL_COMMUNITY)
Admission: RE | Admit: 2019-06-06 | Discharge: 2019-06-06 | Disposition: A | Discharge status: Home / Self Care | Payer: Medicaid Other | Source: Ambulatory Visit | Attending: Obstetrics and Gynecology | Admitting: Obstetrics and Gynecology

## 2019-06-06 DIAGNOSIS — Z3480 Encounter for supervision of other normal pregnancy, unspecified trimester: Secondary | ICD-10-CM | POA: Insufficient documentation

## 2019-06-06 DIAGNOSIS — O359XX0 Maternal care for (suspected) fetal abnormality and damage, unspecified, not applicable or unspecified: Secondary | ICD-10-CM

## 2019-06-06 DIAGNOSIS — O09212 Supervision of pregnancy with history of pre-term labor, second trimester: Secondary | ICD-10-CM

## 2019-06-06 DIAGNOSIS — Z3A19 19 weeks gestation of pregnancy: Secondary | ICD-10-CM

## 2019-06-06 DIAGNOSIS — Z363 Encounter for antenatal screening for malformations: Secondary | ICD-10-CM | POA: Diagnosis not present

## 2019-06-13 ENCOUNTER — Telehealth (INDEPENDENT_AMBULATORY_CARE_PROVIDER_SITE_OTHER): Payer: Medicaid Other | Admitting: Obstetrics

## 2019-06-13 ENCOUNTER — Encounter: Payer: Self-pay | Admitting: Obstetrics

## 2019-06-13 DIAGNOSIS — Z3480 Encounter for supervision of other normal pregnancy, unspecified trimester: Secondary | ICD-10-CM

## 2019-06-13 DIAGNOSIS — Z3482 Encounter for supervision of other normal pregnancy, second trimester: Secondary | ICD-10-CM

## 2019-06-13 DIAGNOSIS — Z3A20 20 weeks gestation of pregnancy: Secondary | ICD-10-CM

## 2019-06-13 NOTE — Progress Notes (Signed)
TELEHEALTH OBSTETRICS PRENATAL VIRTUAL VIDEO VISIT ENCOUNTER NOTE  Provider location: [facility] at [place]   I connected with Steward Ros on 06/13/19 at 10:00 AM EDT by OB MyChart Video Encounter at home and verified that I am speaking with the correct person using two identifiers.   I discussed the limitations, risks, security and privacy concerns of performing an evaluation and management service virtually and the availability of in person appointments. I also discussed with the patient that there may be a patient responsible charge related to this service. The patient expressed understanding and agreed to proceed. Subjective:  Michelle Bridges is a 28 y.o. G2P1001 at [redacted]w[redacted]d being seen today for ongoing prenatal care.  She is currently monitored for the following issues for this low-risk pregnancy and has Vitamin D deficiency; Postpartum care following vaginal delivery; and Supervision of other normal pregnancy, antepartum on their problem list.  Patient reports heartburn.  Contractions: Not present. Vag. Bleeding: None.  Movement: Present. Denies any leaking of fluid.   The following portions of the patient's history were reviewed and updated as appropriate: allergies, current medications, past family history, past medical history, past social history, past surgical history and problem list.   Objective:  There were no vitals filed for this visit.  Fetal Status:     Movement: Present     General:  Alert, oriented and cooperative. Patient is in no acute distress.  Respiratory: Normal respiratory effort, no problems with respiration noted  Mental Status: Normal mood and affect. Normal behavior. Normal judgment and thought content.  Rest of physical exam deferred due to type of encounter  Imaging: [place] Mfm Ob Comp + 14 Wk  Result Date: 06/06/2019 ----------------------------------------------------------------------  OBSTETRICS REPORT                        (Signed Final 06/06/2019 04:20 pm) ---------------------------------------------------------------------- Patient Info  ID #:       [id number]                          D.O.B.:  [birth year] (27 yrs)  Name:       Michelle Darby-                Visit Date: 06/06/2019 01:47 pm              GARCIA ---------------------------------------------------------------------- Performed By  Performed By:     Lavone Nian Phy.:   [place]  Attending:        Johnell Comings MD         Address:          [address]  [address]                                                             [place]                                                             [address]  Referred By:      Roxy Cedar             [facility]                                 Fetal Care ---------------------------------------------------------------------- Orders   #  Description                          Code         Ordered By   1  [place] MFM OB COMP + 14 WK               76805.01     Clayton Bibles  ----------------------------------------------------------------------   #  Order #                    Accession #                 Episode #   1  [id number]                  [id number]                  [id number]  ---------------------------------------------------------------------- Indications   Fetal abnormality - other known or suspected   O35.9XX0   Poor obstetric history (Cholestasis)           O09.219   Encounter for antenatal screening for          Z36.3   malformations (DECLINED GENETIC   TESTING)   [redacted] weeks gestation of pregnancy                Z3A.19  ---------------------------------------------------------------------- Fetal Evaluation  Num Of Fetuses:         1  Fetal Heart  Rate(bpm):  148  Cardiac Activity:       Observed  Presentation:           Cephalic  Placenta:               Anterior Fundal  P. Cord Insertion:      Visualized  Amniotic Fluid  AFI FV:      Within normal limits  Largest Pocket(cm)                              5.02 ---------------------------------------------------------------------- Biometry  BPD:      39.8  mm     G. Age:  18w 1d         15  %    CI:        73.24   %    70 - 86                                                          FL/HC:      19.1   %    16.1 - 18.3  HC:      147.8  mm     G. Age:  17w 6d          5  %    HC/AC:      1.05        1.09 - 1.39  AC:      141.2  mm     G. Age:  19w 3d         63  %    FL/BPD:     71.1   %  FL:       28.3  mm     G. Age:  18w 5d         31  %    FL/AC:      20.0   %    20 - 24  HUM:      25.7  mm     G. Age:  18w 0d         24  %  CER:      19.1  mm     G. Age:  18w 4d         37  %  NFT:       4.8  mm  LV:        5.6  mm  CM:        3.1  mm  Est. FW:     266  gm      0 lb 9 oz     42  % ---------------------------------------------------------------------- OB History  Gravidity:    2         Term:   1  Living:       1 ---------------------------------------------------------------------- Gestational Age  LMP:           19w 0d        Date:  01/24/19                 EDD:   10/31/19  U/S Today:     18w 4d                                        EDD:   11/03/19  Best:          19w 0d     Det. By:  LMP  (01/24/19)          EDD:   10/31/19 ---------------------------------------------------------------------- Anatomy  Cranium:  Appears normal         Aortic Arch:            Appears normal  Cavum:                 Appears normal         Ductal Arch:            Appears normal  Ventricles:            Appears normal         Diaphragm:              Appears normal  Choroid Plexus:        Appears normal         Stomach:                Appears normal, left                                                                         sided  Cerebellum:            Appears normal         Abdomen:                Appears normal  Posterior Fossa:       Appears normal         Abdominal Wall:         Appears nml (cord                                                                        insert, abd wall)  Nuchal Fold:           Appears normal         Cord Vessels:           Appears normal (3                                                                        vessel cord)  Face:                  Orbits nl; profile not Kidneys:                Not well visualized                         well visualized  Lips:                  Appears normal         Bladder:                Appears normal  Thoracic:  Appears normal         Spine:                  Appears normal  Heart:                 Appears normal         Upper Extremities:      Appears normal                         (4CH, axis, and                         situs)  RVOT:                  Appears normal         Lower Extremities:      Appears normal  LVOT:                  Appears normal ---------------------------------------------------------------------- Cervix Uterus Adnexa  Cervix  Length:           2.11  cm.  Normal appearance by transabdominal scan. ---------------------------------------------------------------------- Comments  This patient was seen for a detailed fetal anatomy scan. She  denies any significant past medical history and denies any  problems in her current pregnancy.  The patient has declined all screening tests for fetal  aneuploidy in her current pregnancy.  She was informed that the fetal growth and amniotic fluid  level were appropriate for her gestational age.  There were no obvious fetal anomalies noted on today's  ultrasound exam.  The patient was informed that anomalies may be missed due  to technical limitations. If the fetus is in a suboptimal position  or maternal habitus is increased, visualization of the fetus in  the  maternal uterus may be impaired.  Follow up as indicated. ----------------------------------------------------------------------                   Ma RingsVictor Fang, MD Electronically Signed Final Report   06/06/2019 04:20 pm ----------------------------------------------------------------------   Assessment and Plan:  Pregnancy: G2P1001 at 1587w0d 1. Supervision of other normal pregnancy, antepartum   Preterm labor symptoms and general obstetric precautions including but not limited to vaginal bleeding, contractions, leaking of fluid and fetal movement were reviewed in detail with the patient. I discussed the assessment and treatment plan with the patient. The patient was provided an opportunity to ask questions and all were answered. The patient agreed with the plan and demonstrated an understanding of the instructions. The patient was advised to call back or seek an in-person office evaluation/[facility] for any urgent or concerning symptoms. Please refer to After Visit Summary for other counseling recommendations.   I provided 10 minutes of face-to-face time during this encounter.  Return in about 4 weeks (around 07/11/2019) for MyChart.    Coral Ceoharles Harper, MD [facility] 06/13/2019

## 2019-06-13 NOTE — Progress Notes (Signed)
ROB With No complaints.

## 2019-07-11 ENCOUNTER — Encounter: Payer: Self-pay | Admitting: Obstetrics

## 2019-07-11 ENCOUNTER — Telehealth (INDEPENDENT_AMBULATORY_CARE_PROVIDER_SITE_OTHER): Payer: Medicaid Other | Admitting: Obstetrics

## 2019-07-11 DIAGNOSIS — Z3482 Encounter for supervision of other normal pregnancy, second trimester: Secondary | ICD-10-CM

## 2019-07-11 DIAGNOSIS — Z3480 Encounter for supervision of other normal pregnancy, unspecified trimester: Secondary | ICD-10-CM

## 2019-07-11 DIAGNOSIS — Z3A24 24 weeks gestation of pregnancy: Secondary | ICD-10-CM

## 2019-07-11 NOTE — Progress Notes (Signed)
S/w patient for mychart visit. Pt reports fetal movement and occasional pressure.

## 2019-07-11 NOTE — Progress Notes (Signed)
   TELEHEALTH OBSTETRICS PRENATAL VIRTUAL VIDEO VISIT ENCOUNTER NOTE  Provider location: [facility] at [place]   I connected with Steward Ros on 07/11/19 at  2:30 PM EST by [facility] MyChart Video Encounter at home and verified that I am speaking with the correct person using two identifiers.   I discussed the limitations, risks, security and privacy concerns of performing an evaluation and management service virtually and the availability of in person appointments. I also discussed with the patient that there may be a patient responsible charge related to this service. The patient expressed understanding and agreed to proceed. Subjective:  Michelle Bridges is a 28 y.o. G2P1001 at [redacted]w[redacted]d being seen today for ongoing prenatal care.  She is currently monitored for the following issues for this low-risk pregnancy and has Vitamin D deficiency; Postpartum care following vaginal delivery; and Supervision of other normal pregnancy, antepartum on their problem list.  Patient reports no complaints.  Contractions: Not present. Vag. Bleeding: None.  Movement: Present. Denies any leaking of fluid.   The following portions of the patient's history were reviewed and updated as appropriate: allergies, current medications, past family history, past medical history, past social history, past surgical history and problem list.   Objective:   Vitals:   07/11/19 1436  BP: (!) 94/59  Pulse: 85    Fetal Status:     Movement: Present     General:  Alert, oriented and cooperative. Patient is in no acute distress.  Respiratory: Normal respiratory effort, no problems with respiration noted  Mental Status: Normal mood and affect. Normal behavior. Normal judgment and thought content.  Rest of physical exam deferred due to type of encounter  Imaging: No results found.  Assessment and Plan:  Pregnancy: G2P1001 at [redacted]w[redacted]d 1. Supervision of other normal pregnancy, antepartum   Preterm  labor symptoms and general obstetric precautions including but not limited to vaginal bleeding, contractions, leaking of fluid and fetal movement were reviewed in detail with the patient. I discussed the assessment and treatment plan with the patient. The patient was provided an opportunity to ask questions and all were answered. The patient agreed with the plan and demonstrated an understanding of the instructions. The patient was advised to call back or seek an in-person office evaluation/go to [facility] at [facility] for any urgent or concerning symptoms. Please refer to After Visit Summary for other counseling recommendations.   I provided 10 minutes of face-to-face time during this encounter.  Return in about 4 weeks (around 08/08/2019) for ROB, 2 hour OGTT.   Baltazar Najjar, MD [facility] 07/11/2019

## 2019-08-08 ENCOUNTER — Encounter: Payer: Self-pay | Admitting: Obstetrics and Gynecology

## 2019-08-08 ENCOUNTER — Other Ambulatory Visit: Payer: Self-pay

## 2019-08-08 ENCOUNTER — Other Ambulatory Visit: Payer: Medicaid Other

## 2019-08-08 ENCOUNTER — Ambulatory Visit (INDEPENDENT_AMBULATORY_CARE_PROVIDER_SITE_OTHER): Payer: Medicaid Other | Admitting: Obstetrics and Gynecology

## 2019-08-08 VITALS — BP 90/57 | HR 69 | Wt 123.4 lb

## 2019-08-08 DIAGNOSIS — Z3483 Encounter for supervision of other normal pregnancy, third trimester: Secondary | ICD-10-CM

## 2019-08-08 DIAGNOSIS — Z23 Encounter for immunization: Secondary | ICD-10-CM | POA: Diagnosis not present

## 2019-08-08 DIAGNOSIS — Z3480 Encounter for supervision of other normal pregnancy, unspecified trimester: Secondary | ICD-10-CM

## 2019-08-08 DIAGNOSIS — Z3A28 28 weeks gestation of pregnancy: Secondary | ICD-10-CM

## 2019-08-08 NOTE — Patient Instructions (Signed)
Fetal Movement Counts Patient Name: ________________________________________________ Patient Due Date: ____________________ What is a fetal movement count?  A fetal movement count is the number of times that you feel your baby move during a certain amount of time. This may also be called a fetal kick count. A fetal movement count is recommended for every pregnant woman. You may be asked to start counting fetal movements as early as week 28 of your pregnancy. Pay attention to when your baby is most active. You may notice your baby's sleep and wake cycles. You may also notice things that make your baby move more. You should do a fetal movement count:  When your baby is normally most active.  At the same time each day. A good time to count movements is while you are resting, after having something to eat and drink. How do I count fetal movements? 1. Find a quiet, comfortable area. Sit, or lie down on your side. 2. Write down the date, the start time and stop time, and the number of movements that you felt between those two times. Take this information with you to your health care visits. 3. For 2 hours, count kicks, flutters, swishes, rolls, and jabs. You should feel at least 10 movements during 2 hours. 4. You may stop counting after you have felt 10 movements. 5. If you do not feel 10 movements in 2 hours, have something to eat and drink. Then, keep resting and counting for 1 hour. If you feel at least 4 movements during that hour, you may stop counting. Contact a health care provider if:  You feel fewer than 4 movements in 2 hours.  Your baby is not moving like he or she usually does. Date: ____________ Start time: ____________ Stop time: ____________ Movements: ____________ Date: ____________ Start time: ____________ Stop time: ____________ Movements: ____________ Date: ____________ Start time: ____________ Stop time: ____________ Movements: ____________ Date: ____________ Start time:  ____________ Stop time: ____________ Movements: ____________ Date: ____________ Start time: ____________ Stop time: ____________ Movements: ____________ Date: ____________ Start time: ____________ Stop time: ____________ Movements: ____________ Date: ____________ Start time: ____________ Stop time: ____________ Movements: ____________ Date: ____________ Start time: ____________ Stop time: ____________ Movements: ____________ Date: ____________ Start time: ____________ Stop time: ____________ Movements: ____________ This information is not intended to replace advice given to you by your health care provider. Make sure you discuss any questions you have with your health care provider. Document Released: 09/23/2006 Document Revised: 09/13/2018 Document Reviewed: 10/03/2015 Elsevier Patient Education  [place]. Iron-Rich Diet  Iron is a mineral that helps your body to produce hemoglobin. Hemoglobin is a protein in red blood cells that carries oxygen to your body's tissues. Eating too little iron may cause you to feel weak and tired, and it can increase your risk of infection. Iron is naturally found in many foods, and many foods have iron added to them (iron-fortified foods). You may need to follow an iron-rich diet if you do not have enough iron in your body due to certain medical conditions. The amount of iron that you need each day depends on your age, your sex, and any medical conditions you have. Follow instructions from your health care provider or a diet and nutrition specialist (dietitian) about how much iron you should eat each day. What are tips for following this plan? Reading food labels  Check food labels to see how many milligrams (mg) of iron are in each serving. Cooking  Cook foods in pots and pans that are made  from iron.  Take these steps to make it easier for your body to absorb iron from certain foods: ? Soak beans overnight before cooking. ? Soak whole grains  overnight and drain them before using. ? Ferment flours before baking, such as by using yeast in bread dough. Meal planning  When you eat foods that contain iron, you should eat them with foods that are high in vitamin C. These include oranges, peppers, tomatoes, potatoes, and mango. Vitamin C helps your body to absorb iron. General information  Take iron supplements only as told by your health care provider. An overdose of iron can be life-threatening. If you were prescribed iron supplements, take them with orange juice or a vitamin C supplement.  When you eat iron-fortified foods or take an iron supplement, you should also eat foods that naturally contain iron, such as meat, poultry, and fish. Eating naturally iron-rich foods helps your body to absorb the iron that is added to other foods or contained in a supplement.  Certain foods and drinks prevent your body from absorbing iron properly. Avoid eating these foods in the same meal as iron-rich foods or with iron supplements. These foods include: ? Coffee, black tea, and red wine. ? Milk, dairy products, and foods that are high in calcium. ? Beans and soybeans. ? Whole grains. What foods should I eat? Fruits Prunes. Raisins. Eat fruits high in vitamin C, such as oranges, grapefruits, and strawberries, alongside iron-rich foods. Vegetables Spinach (cooked). Green peas. Broccoli. Fermented vegetables. Eat vegetables high in vitamin C, such as leafy greens, potatoes, bell peppers, and tomatoes, alongside iron-rich foods. Grains Iron-fortified breakfast cereal. Iron-fortified whole-wheat bread. Enriched rice. Sprouted grains. Meats and other proteins Beef liver. Oysters. Beef. Shrimp. [place]. Chicken. [place]. Sardines. Chickpeas. Nuts. Tofu. Pumpkin seeds. Beverages Tomato juice. Fresh orange juice. Prune juice. Hibiscus tea. Fortified instant breakfast shakes. Sweets and desserts Blackstrap molasses. Seasonings and condiments Tahini.  Fermented soy sauce. Other foods Wheat germ. The items listed above may not be a complete list of recommended foods and beverages. Contact a dietitian for more information. What foods should I avoid? Grains Whole grains. Bran cereal. Bran flour. Oats. Meats and other proteins Soybeans. Products made from soy protein. Black beans. Lentils. Mung beans. Split peas. Dairy Milk. Cream. Cheese. Yogurt. Cottage cheese. Beverages Coffee. Black tea. Red wine. Sweets and desserts Cocoa. Chocolate. Ice cream. Other foods Basil. Oregano. Large amounts of parsley. The items listed above may not be a complete list of foods and beverages to avoid. Contact a dietitian for more information. Summary  Iron is a mineral that helps your body to produce hemoglobin. Hemoglobin is a protein in red blood cells that carries oxygen to your body's tissues.  Iron is naturally found in many foods, and many foods have iron added to them (iron-fortified foods).  When you eat foods that contain iron, you should eat them with foods that are high in vitamin C. Vitamin C helps your body to absorb iron.  Certain foods and drinks prevent your body from absorbing iron properly, such as whole grains and dairy products. You should avoid eating these foods in the same meal as iron-rich foods or with iron supplements. This information is not intended to replace advice given to you by your health care provider. Make sure you discuss any questions you have with your health care provider. Document Released: 04/07/2005 Document Revised: 08/06/2017 Document Reviewed: 07/20/2017 Elsevier Patient Education  2020 [employer].

## 2019-08-08 NOTE — Progress Notes (Signed)
   LOW-RISK PREGNANCY OFFICE VISIT Patient name: Michelle Bridges MRN [id number]  Date of birth: November 13, 1990 Chief Complaint:   Routine Prenatal Visit  History of Present Illness:   Michelle Bridges is a 28 y.o. G26P1001 female at 110w0d with an Estimated Date of Delivery: 10/31/19 being seen today for ongoing management of a low-risk pregnancy.  Today she reports no complaints. Here for 2hr GTT today. She received her tdap and flu vaccine today as well. Contractions: Not present. Vag. Bleeding: None.  Movement: Present. denies leaking of fluid. Review of Systems:   Pertinent items are noted in HPI Denies abnormal vaginal discharge w/ itching/odor/irritation, headaches, visual changes, shortness of breath, chest pain, abdominal pain, severe nausea/vomiting, or problems with urination or bowel movements unless otherwise stated above. Pertinent History Reviewed:  Reviewed past medical,surgical, social, obstetrical and family history.  Reviewed problem list, medications and allergies. Physical Assessment:   Vitals:   08/08/19 0843  BP: (!) 90/57  Pulse: 69  Weight: 123 lb 6.4 oz (56 kg)  Body mass index is 24.1 kg/m.        Physical Examination:   General appearance: Well appearing, and in no distress  Mental status: Alert, oriented to person, place, and time  Skin: Warm & dry  Cardiovascular: Normal heart rate noted  Respiratory: Normal respiratory effort, no distress  Abdomen: Soft, gravid, nontender  Pelvic: Cervical exam deferred         Extremities: Edema: None  Fetal Status: Fetal Heart Rate (bpm): 160 Fundal Height: 27 cm Movement: Present Presentation: Undeterminable  No results found for this or any previous visit (from the past 24 hour(s)).  Assessment & Plan:  1) Low-risk pregnancy G2P1001 at [redacted]w[redacted]d with an Estimated Date of Delivery: 10/31/19   2) Supervision of other normal pregnancy, antepartum  - Discussed optimized OB schedule - plan My Chart video visit  in 4 weeks     Meds: No orders of the defined types were placed in this encounter.  Labs/procedures today: 2 Hour GTT, CBC, RPR, HIV antibody  Plan:  Continue routine obstetrical care   Reviewed: Preterm labor symptoms and general obstetric precautions including but not limited to vaginal bleeding, contractions, leaking of fluid and fetal movement were reviewed in detail with the patient.  All questions were answered. Has home bp cuff. Check bp weekly, let us know if >140/90.   Follow-up: Return in about 4 weeks (around 09/05/2019) for Return OB - My Chart video.  Orders Placed This Encounter  Procedures  . Tdap vaccine greater than or equal to 7yo IM  . Flu Vaccine QUAD 36+ mos IM   Laury Deep MSN, CNM 08/08/2019 9:01 AM

## 2019-08-08 NOTE — Progress Notes (Signed)
Pt presents for 2 gtt labs. Flu vaccine and Tdap given w/o difficulty.  Her mother recently diagnosed with breast ca

## 2019-08-09 ENCOUNTER — Other Ambulatory Visit: Payer: Self-pay

## 2019-08-09 DIAGNOSIS — Z20822 Contact with and (suspected) exposure to covid-19: Secondary | ICD-10-CM

## 2019-08-09 LAB — CBC
Hematocrit: 34.6 % (ref 34.0–46.6)
Hemoglobin: 11.7 g/dL (ref 11.1–15.9)
MCH: 30.4 pg (ref 26.6–33.0)
MCHC: 33.8 g/dL (ref 31.5–35.7)
MCV: 90 fL (ref 79–97)
Platelets: 228 10*3/uL (ref 150–450)
RBC: 3.85 x10E6/uL (ref 3.77–5.28)
RDW: 13 % (ref 11.7–15.4)
WBC: 8.3 10*3/uL (ref 3.4–10.8)

## 2019-08-09 LAB — RPR: RPR Ser Ql: NONREACTIVE

## 2019-08-09 LAB — GLUCOSE TOLERANCE, 2 HOURS W/ 1HR
Glucose, 1 hour: 140 mg/dL (ref 65–179)
Glucose, 2 hour: 99 mg/dL (ref 65–152)
Glucose, Fasting: 75 mg/dL (ref 65–91)

## 2019-08-09 LAB — HIV ANTIBODY (ROUTINE TESTING W REFLEX): HIV Screen 4th Generation wRfx: NONREACTIVE

## 2019-08-11 LAB — NOVEL CORONAVIRUS, NAA: SARS-CoV-2, NAA: NOT DETECTED

## 2019-09-05 ENCOUNTER — Encounter: Payer: Self-pay | Admitting: Obstetrics & Gynecology

## 2019-09-05 ENCOUNTER — Other Ambulatory Visit: Payer: Self-pay

## 2019-09-05 ENCOUNTER — Ambulatory Visit (INDEPENDENT_AMBULATORY_CARE_PROVIDER_SITE_OTHER): Payer: Medicaid Other | Admitting: Obstetrics & Gynecology

## 2019-09-05 DIAGNOSIS — Z8759 Personal history of other complications of pregnancy, childbirth and the puerperium: Secondary | ICD-10-CM | POA: Insufficient documentation

## 2019-09-05 DIAGNOSIS — Z8719 Personal history of other diseases of the digestive system: Secondary | ICD-10-CM | POA: Insufficient documentation

## 2019-09-05 DIAGNOSIS — Z3A32 32 weeks gestation of pregnancy: Secondary | ICD-10-CM

## 2019-09-05 DIAGNOSIS — Z3483 Encounter for supervision of other normal pregnancy, third trimester: Secondary | ICD-10-CM

## 2019-09-05 DIAGNOSIS — Z3480 Encounter for supervision of other normal pregnancy, unspecified trimester: Secondary | ICD-10-CM

## 2019-09-05 NOTE — Patient Instructions (Signed)

## 2019-09-05 NOTE — Progress Notes (Signed)
Patient reports fetal movement with occassional pressure.

## 2019-09-05 NOTE — Progress Notes (Signed)
   PRENATAL VISIT NOTE  Subjective:  Michelle Bridges is a 28 y.o. G2P1001 at [redacted]w[redacted]d being seen today for ongoing prenatal care.  She is currently monitored for the following issues for this high-risk pregnancy and has Vitamin D deficiency; Postpartum care following vaginal delivery; and Supervision of other normal pregnancy, antepartum on their problem list.  Patient reports no complaints.  Contractions: Not present. Vag. Bleeding: None.  Movement: Present. Denies leaking of fluid.   The following portions of the patient's history were reviewed and updated as appropriate: allergies, current medications, past family history, past medical history, past social history, past surgical history and problem list.   Objective:   Vitals:   09/05/19 1043  BP: 101/62  Pulse: 80  Weight: 126 lb (57.2 kg)    Fetal Status: Fetal Heart Rate (bpm): 140   Movement: Present     General:  Alert, oriented and cooperative. Patient is in no acute distress.  Skin: Skin is warm and dry. No rash noted.   Cardiovascular: Normal heart rate noted  Respiratory: Normal respiratory effort, no problems with respiration noted  Abdomen: Soft, gravid, appropriate for gestational age.  Pain/Pressure: Present     Pelvic: Cervical exam deferred        Extremities: Normal range of motion.  Edema: None  Mental Status: Normal mood and affect. Normal behavior. Normal judgment and thought content.   Assessment and Plan:  Pregnancy: G2P1001 at [redacted]w[redacted]d 1. Supervision of other normal pregnancy, antepartum No sx of cholestasis  Preterm labor symptoms and general obstetric precautions including but not limited to vaginal bleeding, contractions, leaking of fluid and fetal movement were reviewed in detail with the patient. Please refer to After Visit Summary for other counseling recommendations.   Return in about 2 weeks (around 09/19/2019) for virtual.  No future appointments.  Emeterio Reeve, MD

## 2019-09-08 NOTE — L&D Delivery Note (Addendum)
Delivery Note At 7:21 AM a viable female was delivered via Vaginal, Spontaneous (Presentation: RIGHT Occiput Anterior) with 1 nuchal cord draped around Baby's shoulder like a shawl, easily reduced at the perineum. Delivery with Terminal Meconium. APGAR: 8, 9; weight: per medical record.   Placenta status: Spontaneous, Intact, to L&D.   Cord: 3 vessels with the following complications: None.   Cord pH: N/A  Membranes ruptured at 07:16 on 10/25/2019, produced clear amniotic fluid.  Anesthesia: None; 1% lidocaine used for Laceration Repair Episiotomy: None Lacerations: 2nd degree Suture Repair: 3.0 vicryl Rapide Est. Blood Loss (mL):  Mom to postpartum.  Baby to Couplet care / Skin to Skin.  Michelle Bridges 10/25/2019, 7:58 AM  GME ATTESTATION:  I saw and evaluated the patient. I was gloved throughout the entire delivery and supervised the resident during both the delivery and the perineal repair. I agree with the findings and the plan of care as documented in the resident's note.  Marlowe Alt, DO OB Fellow, Faculty [facility], Center for [facility] 10/25/2019 8:18 AM

## 2019-09-19 ENCOUNTER — Telehealth (INDEPENDENT_AMBULATORY_CARE_PROVIDER_SITE_OTHER): Payer: Medicaid Other | Admitting: Obstetrics

## 2019-09-19 ENCOUNTER — Encounter: Payer: Self-pay | Admitting: Obstetrics

## 2019-09-19 ENCOUNTER — Other Ambulatory Visit: Payer: Self-pay

## 2019-09-19 DIAGNOSIS — Z3A34 34 weeks gestation of pregnancy: Secondary | ICD-10-CM

## 2019-09-19 DIAGNOSIS — Z3483 Encounter for supervision of other normal pregnancy, third trimester: Secondary | ICD-10-CM

## 2019-09-19 DIAGNOSIS — Z3480 Encounter for supervision of other normal pregnancy, unspecified trimester: Secondary | ICD-10-CM

## 2019-09-19 NOTE — Progress Notes (Signed)
   TELEHEALTH OBSTETRICS PRENATAL VIRTUAL VIDEO VISIT ENCOUNTER NOTE  Provider location: Center for [facility] at [place]   I connected with Michelle Bridges on 09/19/19 at  2:00 PM EST by OB MyChart Video Encounter at home and verified that I am speaking with the correct person using two identifiers.   I discussed the limitations, risks, security and privacy concerns of performing an evaluation and management service virtually and the availability of in person appointments. I also discussed with the patient that there may be a patient responsible charge related to this service. The patient expressed understanding and agreed to proceed. Subjective:  Michelle Bridges is a 29 y.o. G2P1001 at [redacted]w[redacted]d being seen today for ongoing prenatal care.  She is currently monitored for the following issues for this low-risk pregnancy and has Vitamin D deficiency; Supervision of other normal pregnancy, antepartum; and History of cholestasis during pregnancy on their problem list.  Patient reports backache and pelvic pressure.  Contractions: Irritability. Vag. Bleeding: None.  Movement: Present. Denies any leaking of fluid.   The following portions of the patient's history were reviewed and updated as appropriate: allergies, current medications, past family history, past medical history, past social history, past surgical history and problem list.   Objective:   Vitals:   09/19/19 1314  BP: 99/60  Pulse: 80    Fetal Status:     Movement: Present     General:  Alert, oriented and cooperative. Patient is in no acute distress.  Respiratory: Normal respiratory effort, no problems with respiration noted  Mental Status: Normal mood and affect. Normal behavior. Normal judgment and thought content.  Rest of physical exam deferred due to type of encounter  Imaging: No results found.  Assessment and Plan:  Pregnancy: G2P1001 at [redacted]w[redacted]d 1. Supervision of other normal pregnancy,  antepartum   Preterm labor symptoms and general obstetric precautions including but not limited to vaginal bleeding, contractions, leaking of fluid and fetal movement were reviewed in detail with the patient. I discussed the assessment and treatment plan with the patient. The patient was provided an opportunity to ask questions and all were answered. The patient agreed with the plan and demonstrated an understanding of the instructions. The patient was advised to call back or seek an in-person office evaluation/go to MAU at [facility] for any urgent or concerning symptoms. Please refer to After Visit Summary for other counseling recommendations.   I provided 10 minutes of face-to-face time during this encounter.  Return in about 2 weeks (around 10/03/2019) for ROB.  Future Appointments  Date Time Provider Department Center  09/19/2019  2:00 PM Brock Bad, MD [facility] None    Coral Ceo, MD Center for [facility], [facility] 09/19/2019 1:27 PM                                                                                    Virtual Visit via Telephone Note  I connected with Michelle Bridges on 09/19/19 at  2:00 PM EST by telephone and verified that I am speaking with the correct person using two identifiers.  ROB w/o complaints today.

## 2019-10-03 ENCOUNTER — Ambulatory Visit (INDEPENDENT_AMBULATORY_CARE_PROVIDER_SITE_OTHER): Payer: Medicaid Other | Admitting: Family Medicine

## 2019-10-03 ENCOUNTER — Other Ambulatory Visit: Payer: Self-pay

## 2019-10-03 ENCOUNTER — Encounter: Payer: Self-pay | Admitting: Family Medicine

## 2019-10-03 ENCOUNTER — Other Ambulatory Visit (HOSPITAL_COMMUNITY)
Admission: RE | Admit: 2019-10-03 | Discharge: 2019-10-03 | Disposition: A | Discharge status: Home / Self Care | Payer: Medicaid Other | Source: Ambulatory Visit | Attending: Family Medicine | Admitting: Family Medicine

## 2019-10-03 VITALS — BP 101/67 | HR 79 | Wt 128.4 lb

## 2019-10-03 DIAGNOSIS — Z8719 Personal history of other diseases of the digestive system: Secondary | ICD-10-CM

## 2019-10-03 DIAGNOSIS — Z3A36 36 weeks gestation of pregnancy: Secondary | ICD-10-CM

## 2019-10-03 DIAGNOSIS — Z3480 Encounter for supervision of other normal pregnancy, unspecified trimester: Secondary | ICD-10-CM

## 2019-10-03 DIAGNOSIS — E559 Vitamin D deficiency, unspecified: Secondary | ICD-10-CM

## 2019-10-03 DIAGNOSIS — Z3483 Encounter for supervision of other normal pregnancy, third trimester: Secondary | ICD-10-CM

## 2019-10-03 NOTE — Patient Instructions (Addendum)
Third Trimester of Pregnancy  The third trimester is from week 28 through week 40 (months 7 through 9). This trimester is when your unborn baby (fetus) is growing very fast. At the end of the ninth month, the unborn baby is about 20 inches in length. It weighs about 6-10 pounds. Follow these instructions at home: Medicines  Take over-the-counter and prescription medicines only as told by your doctor. Some medicines are safe and some medicines are not safe during pregnancy.  Take a prenatal vitamin that contains at least 600 micrograms (mcg) of folic acid.  If you have trouble pooping (constipation), take medicine that will make your stool soft (stool softener) if your doctor approves. Eating and drinking   Eat regular, healthy meals.  Avoid raw meat and uncooked cheese.  If you get low calcium from the food you eat, talk to your doctor about taking a daily calcium supplement.  Eat four or five small meals rather than three large meals a day.  Avoid foods that are high in fat and sugars, such as fried and sweet foods.  To prevent constipation: ? Eat foods that are high in fiber, like fresh fruits and vegetables, whole grains, and beans. ? Drink enough fluids to keep your pee (urine) clear or pale yellow. Activity  Exercise only as told by your doctor. Stop exercising if you start to have cramps.  Avoid heavy lifting, wear low heels, and sit up straight.  Do not exercise if it is too hot, too humid, or if you are in a place of great height (high altitude).  You may continue to have sex unless your doctor tells you not to. Relieving pain and discomfort  Wear a good support bra if your breasts are tender.  Take frequent breaks and rest with your legs raised if you have leg cramps or low back pain.  Take warm water baths (sitz baths) to soothe pain or discomfort caused by hemorrhoids. Use hemorrhoid cream if your doctor approves.  If you develop puffy, bulging veins (varicose  veins) in your legs: ? Wear support hose or compression stockings as told by your doctor. ? Raise (elevate) your feet for 15 minutes, 3-4 times a day. ? Limit salt in your food. Safety  Wear your seat belt when driving.  Make a list of emergency phone numbers, including numbers for family, friends, the hospital, and police and fire departments. Preparing for your baby's arrival To prepare for the arrival of your baby:  Take prenatal classes.  Practice driving to the hospital.  Visit the hospital and tour the maternity area.  Talk to your work about taking leave once the baby comes.  Pack your hospital bag.  Prepare the baby's room.  Go to your doctor visits.  Buy a rear-facing car seat. Learn how to install it in your car. General instructions  Do not use hot tubs, steam rooms, or saunas.  Do not use any products that contain nicotine or tobacco, such as cigarettes and e-cigarettes. If you need help quitting, ask your doctor.  Do not drink alcohol.  Do not douche or use tampons or scented sanitary pads.  Do not cross your legs for long periods of time.  Do not travel for long distances unless you must. Only do so if your doctor says it is okay.  Visit your dentist if you have not gone during your pregnancy. Use a soft toothbrush to brush your teeth. Be gentle when you floss.  Avoid cat litter boxes and soil   used by cats. These carry germs that can cause birth defects in the baby and can cause a loss of your baby (miscarriage) or stillbirth.  Keep all your prenatal visits as told by your doctor. This is important. Contact a doctor if:  You are not sure if you are in labor or if your water has broken.  You are dizzy.  You have mild cramps or pressure in your lower belly.  You have a nagging pain in your belly area.  You continue to feel sick to your stomach, you throw up, or you have watery poop.  You have bad smelling fluid coming from your vagina.  You have  pain when you pee. Get help right away if:  You have a fever.  You are leaking fluid from your vagina.  You are spotting or bleeding from your vagina.  You have severe belly cramps or pain.  You lose or gain weight quickly.  You have trouble catching your breath and have chest pain.  You notice sudden or extreme puffiness (swelling) of your face, hands, ankles, feet, or legs.  You have not felt the baby move in over an hour.  You have severe headaches that do not go away with medicine.  You have trouble seeing.  You are leaking, or you are having a gush of fluid, from your vagina before you are 37 weeks.  You have regular belly spasms (contractions) before you are 37 weeks. Summary  The third trimester is from week 28 through week 40 (months 7 through 9). This time is when your unborn baby is growing very fast.  Follow your doctor's advice about medicine, food, and activity.  Get ready for the arrival of your baby by taking prenatal classes, getting all the baby items ready, preparing the baby's room, and visiting your doctor to be checked.  Get help right away if you are bleeding from your vagina, or you have chest pain and trouble catching your breath, or if you have not felt your baby move in over an hour. This information is not intended to replace advice given to you by your health care provider. Make sure you discuss any questions you have with your health care provider. Document Revised: 12/15/2018 Document Reviewed: 09/29/2016 Elsevier Patient Education  2020 Elsevier   [employer]  This circuit takes at least 90 minutes to complete so clear your schedule and make mental preparations so you can relax in your environment. The second step requires a lot of pillows so gather them up before beginning Before starting, you should empty your bladder! Have a nice drink nearby, and make sure it has a straw! If you are having contractions, this circuit should  be done through contractions, try not to change positions between steps Before you begin...  "I named this 'circuit' after my friend Deneen Harts, who shared and discussed it with me when I was working with a client whose labor seemed to be stalled out and no longer progressing... This circuit is useful to help get the baby lined up, ideally, in the "Left Occiput Anterior" (LOA) Position, both before labor begins and when some corrections need to be done during labor. Prenatally, this position set can help to rotate a baby. As a natural method of induction, this can help get things going if baby just needed a gentle nudge of position to set things off. To the best of my knowledge, this group of positions will not "hurt" a baby that is already lined up  correctly." - Sharon Muza   Step One: Open-knee Chest Stay in this position for 30 minutes, start in cat/cow, then drop your chest as low as you can to the bed or the floor and your bottom as high as you can. Knees should be fairly wide apart, and the angle between the torso/thighs should be wider than 90 degrees. Wiggle around, prop with lots of pillows and use this time to get totally relaxed. This position allows the baby to scoot out of the pelvis a bit and gives them room to rotate, shift their head position, etc. If the pregnant person finds it helpful, careful positioning with a rebozo under the belly, with gentle tension from a support person behind can help maintain this position for the full 30 minutes.  Step Two:Exaggerated Left Side Lying Roll to your left side, bringing your top leg as high as possible and keeping your bottom leg straight. Roll forward as much as possible, again using a lot of pillows. Sink into the bed and relax some more. If you fall asleep, that's totally okay and you can stay there! If not, stay here for at least another half an hour. Try and get your top right leg up towards your head and get as  rolled over onto your belly as much as possible. If you repeat the circuit during labor, try alternating left and right sides. We know the photo the left is actually right side... just flip the image in your head.  Step Three: Moving and Lunges Lunge, walk stairs facing sideways, 2 at a time, (have a spotter downstairs of you!), take a walk outside with one foot on the curb and the other on the street, sit on a birth ball and hula- anything that's upright and putting your pelvis in open, asymmetrical positions. Spend at least 30 minutes doing this one as well to give your baby a chance to move down. If you are lunging or stair or curb walking, you should lunge/walk/go up stairs in the direction that feels better to you. The key with the lunge is that the toes of the higher leg and mom's belly button should be at right angles. Do not lunge over your knee, that closes the pelvis.     Megan Hamilton Miles: Circuit Creator - [email or web address] Sharon Muza, CD, BDT (DONA), LCCE, FACCE: Supporting Content - [email or web address] Emily Weaver Brown: Photography - [email or web address] Kate Dewey CD/CDT (BAI): Print and Webmaster - [email or web address] [employer] Master[employer] [email or web address]  

## 2019-10-03 NOTE — Progress Notes (Signed)
Subjective:  Michelle Bridges is a 29 y.o. G2P1001 at [redacted]w[redacted]d being seen today for ongoing prenatal care.  She is currently monitored for the following issues for this low-risk pregnancy and has Vitamin D deficiency; Supervision of other normal pregnancy, antepartum; and History of cholestasis during pregnancy on their problem list.  Patient reports no complaints.  Contractions: Irregular. Vag. Bleeding: None.  Movement: Present. Denies leaking of fluid.   The following portions of the patient's history were reviewed and updated as appropriate: allergies, current medications, past family history, past medical history, past social history, past surgical history and problem list. Problem list updated.  Objective:   Vitals:   10/03/19 1054  BP: 101/67  Pulse: 79  Weight: 128 lb 6.4 oz (58.2 kg)    Fetal Status: Fetal Heart Rate (bpm): 140   Movement: Present     General:  Alert, oriented and cooperative. Patient is in no acute distress.  Skin: Skin is warm and dry. No rash noted.   Cardiovascular: Normal heart rate noted  Respiratory: Normal respiratory effort, no problems with respiration noted  Abdomen: Soft, gravid, appropriate for gestational age. Pain/Pressure: Present     Pelvic: Vag. Bleeding: None     Cervical exam deferred        Extremities: Normal range of motion.  Edema: None  Mental Status: Normal mood and affect. Normal behavior. Normal judgment and thought content.   Urinalysis:      Assessment and Plan:  Pregnancy: G2P1001 at [redacted]w[redacted]d  1. Supervision of other normal pregnancy, antepartum - Continue routine prenatal care - GC/CT and GBS swabs collected today  2. History of cholestasis during pregnancy - No signs or symptoms  3. Vitamin D deficiency - Stable  Preterm labor symptoms and general obstetric precautions including but not limited to vaginal bleeding, contractions, leaking of fluid and fetal movement were reviewed in detail with the patient. Please  refer to After Visit Summary for other counseling recommendations.  Return for 1 week ROB, virtual ok.   Cande Mastropietro L, DO

## 2019-10-04 LAB — CERVICOVAGINAL ANCILLARY ONLY
Chlamydia: NEGATIVE
Comment: NEGATIVE
Comment: NORMAL
Neisseria Gonorrhea: NEGATIVE

## 2019-10-05 ENCOUNTER — Ambulatory Visit: Payer: Medicaid Other | Attending: Internal Medicine

## 2019-10-05 DIAGNOSIS — Z20822 Contact with and (suspected) exposure to covid-19: Secondary | ICD-10-CM

## 2019-10-05 LAB — STREP GP B NAA: Strep Gp B NAA: POSITIVE — AB

## 2019-10-06 LAB — NOVEL CORONAVIRUS, NAA: SARS-CoV-2, NAA: NOT DETECTED

## 2019-10-10 ENCOUNTER — Telehealth (INDEPENDENT_AMBULATORY_CARE_PROVIDER_SITE_OTHER): Payer: Medicaid Other | Admitting: Advanced Practice Midwife

## 2019-10-10 DIAGNOSIS — Z20822 Contact with and (suspected) exposure to covid-19: Secondary | ICD-10-CM

## 2019-10-10 DIAGNOSIS — J069 Acute upper respiratory infection, unspecified: Secondary | ICD-10-CM

## 2019-10-10 DIAGNOSIS — Z03818 Encounter for observation for suspected exposure to other biological agents ruled out: Secondary | ICD-10-CM

## 2019-10-10 DIAGNOSIS — Z8719 Personal history of other diseases of the digestive system: Secondary | ICD-10-CM

## 2019-10-10 DIAGNOSIS — Z3480 Encounter for supervision of other normal pregnancy, unspecified trimester: Secondary | ICD-10-CM

## 2019-10-10 DIAGNOSIS — Z8759 Personal history of other complications of pregnancy, childbirth and the puerperium: Secondary | ICD-10-CM

## 2019-10-10 DIAGNOSIS — Z3A37 37 weeks gestation of pregnancy: Secondary | ICD-10-CM

## 2019-10-10 DIAGNOSIS — O26893 Other specified pregnancy related conditions, third trimester: Secondary | ICD-10-CM

## 2019-10-10 NOTE — Progress Notes (Signed)
   TELEHEALTH OBSTETRICS PRENATAL VIRTUAL VIDEO VISIT ENCOUNTER NOTE  Provider location: Center for [facility] at [place]   I connected with Conrad Qulin on 10/10/19 at  2:40 PM EST by MyChart Video Encounter at home and verified that I am speaking with the correct person using two identifiers.   I discussed the limitations, risks, security and privacy concerns of performing an evaluation and management service virtually and the availability of in person appointments. I also discussed with the patient that there may be a patient responsible charge related to this service. The patient expressed understanding and agreed to proceed. Subjective:  Michelle Bridges is a 29 y.o. G2P1001 at [redacted]w[redacted]d being seen today for ongoing prenatal care.  She is currently monitored for the following issues for this low-risk pregnancy and has Vitamin D deficiency; Supervision of other normal pregnancy, antepartum; and History of cholestasis during pregnancy on their problem list.  Patient reports occasional contractions.  Contractions: Irregular. Vag. Bleeding: None.  Movement: Present. Denies any leaking of fluid.   The following portions of the patient's history were reviewed and updated as appropriate: allergies, current medications, past family history, past medical history, past social history, past surgical history and problem list.   Objective:  There were no vitals filed for this visit.  Fetal Status:     Movement: Present     General:  Alert, oriented and cooperative. Patient is in no acute distress.  Respiratory: Normal respiratory effort, no problems with respiration noted  Mental Status: Normal mood and affect. Normal behavior. Normal judgment and thought content.  Rest of physical exam deferred due to type of encounter  Imaging: No results found.  Assessment and Plan:  Pregnancy: G2P1001 at [redacted]w[redacted]d 1. Supervision of other normal pregnancy, antepartum --Pt reports good fetal  movement, denies cramping, LOF, or vaginal bleeding --Anticipatory guidance about next visits/weeks of pregnancy given. --Next visit in 1 week, virtual, then in person at 39 weeks --Pt to enter BP into Babyscripts or call office with BP  2. History of cholestasis during pregnancy --No s/sx this pregnancy  3. Lab test negative for COVID-19 virus --Pt with sore throat, no fever or shortness of breath. Testing on 10/05/19 was negative.  4. Viral upper respiratory tract infection -Mild, reviewed reasons to seek care.  Term labor symptoms and general obstetric precautions including but not limited to vaginal bleeding, contractions, leaking of fluid and fetal movement were reviewed in detail with the patient. I discussed the assessment and treatment plan with the patient. The patient was provided an opportunity to ask questions and all were answered. The patient agreed with the plan and demonstrated an understanding of the instructions. The patient was advised to call back or seek an in-person office evaluation/go to MAU at [facility] for any urgent or concerning symptoms. Please refer to After Visit Summary for other counseling recommendations.   I provided 10 minutes of face-to-face time during this encounter.  No follow-ups on file.  Future Appointments  Date Time Provider Department Center  10/10/2019  2:40 PM Left[place]-Kirby, Wilmer Floor, CNM [facility] None    [name], CNM Center for [facility], [facility]

## 2019-10-10 NOTE — Progress Notes (Signed)
Patient presents for mychart visit. Patient identified with 2 patient identifiers. She has no concerns today.

## 2019-10-17 ENCOUNTER — Telehealth (INDEPENDENT_AMBULATORY_CARE_PROVIDER_SITE_OTHER): Payer: Medicaid Other | Admitting: Women's Health

## 2019-10-17 VITALS — BP 110/71 | HR 83

## 2019-10-17 DIAGNOSIS — B951 Streptococcus, group B, as the cause of diseases classified elsewhere: Secondary | ICD-10-CM

## 2019-10-17 DIAGNOSIS — Z3480 Encounter for supervision of other normal pregnancy, unspecified trimester: Secondary | ICD-10-CM

## 2019-10-17 DIAGNOSIS — E559 Vitamin D deficiency, unspecified: Secondary | ICD-10-CM

## 2019-10-17 DIAGNOSIS — Z3483 Encounter for supervision of other normal pregnancy, third trimester: Secondary | ICD-10-CM

## 2019-10-17 DIAGNOSIS — Z3A38 38 weeks gestation of pregnancy: Secondary | ICD-10-CM

## 2019-10-17 DIAGNOSIS — Z8759 Personal history of other complications of pregnancy, childbirth and the puerperium: Secondary | ICD-10-CM

## 2019-10-17 DIAGNOSIS — Z8719 Personal history of other diseases of the digestive system: Secondary | ICD-10-CM

## 2019-10-17 NOTE — Progress Notes (Signed)
I connected with Michelle Bridges on 10/17/19 at  2:40 PM EST by: MyChart Video and verified that I am speaking with the correct person using two identifiers.  Patient is located at home and provider is located at [facility].     The purpose of this virtual visit is to provide medical care while limiting exposure to the novel coronavirus. I discussed the limitations, risks, security and privacy concerns of performing an evaluation and management service by MyChart Video and the availability of in person appointments. I also discussed with the patient that there may be a patient responsible charge related to this service. By engaging in this virtual visit, you consent to the provision of healthcare.  Additionally, you authorize for your insurance to be billed for the services provided during this visit.  The patient expressed understanding and agreed to proceed.  The following staff members participated in the virtual visit: Donia Ast    PRENATAL VISIT NOTE  Subjective:  Michelle Bridges is a 29 y.o. G2P1001 at [redacted]w[redacted]d  for phone visit for ongoing prenatal care.  She is currently monitored for the following issues for this low-risk pregnancy and has Vitamin D deficiency; Supervision of other normal pregnancy, antepartum; History of cholestasis during pregnancy; and Positive GBS test on their problem list.  Patient reports no complaints.  Contractions: Irregular.  .  Movement: Present. Denies leaking of fluid.   The following portions of the patient's history were reviewed and updated as appropriate: allergies, current medications, past family history, past medical history, past social history, past surgical history and problem list.   Objective:   Vitals:   10/17/19 1332  BP: 110/71  Pulse: 83   Self-Obtained  Fetal Status:     Movement: Present     Assessment and Plan:  Pregnancy: G2P1001 at [redacted]w[redacted]d  1. Supervision of other normal pregnancy, antepartum -in-person visit next  week  2. History of cholestasis during pregnancy -no s/sx this pregnancy -pt advised to call office ASAP if experiencing symptoms and/or present to MAU  3. Positive GBS Test -treat in labor, pt aware  Term labor symptoms and general obstetric precautions including but not limited to vaginal bleeding, contractions, leaking of fluid and fetal movement were reviewed in detail with the patient.  Return in about 1 week (around 10/24/2019) for in-person ROB.  Future Appointments  Date Time Provider Department Center  10/24/2019  2:40 PM Leftwich-Kirby, Wilmer Floor, CNM [facility] None     Time spent on virtual visit: 8 minutes  Marylen Ponto, NP

## 2019-10-17 NOTE — Progress Notes (Signed)
Virtual Visit via Telephone Note  I connected with Michelle Bridges on 10/17/19 at  2:40 PM EST by telephone and verified that I am speaking with the correct person using two identifiers.  Pt c/o low back pain.

## 2019-10-17 NOTE — Patient Instructions (Signed)
Maternity Assessment Unit (MAU)  The Maternity Assessment Unit (MAU) is located at the [facility] at [facility]. The address is: [address] [place], [place], [place] [address]. Please see map below for additional directions.    The Maternity Assessment Unit is designed to help you during your pregnancy, and for up to 6 weeks after delivery, with any pregnancy- or postpartum-related emergencies, if you think you are in labor, or if your water has broken. For example, if you experience nausea and vomiting, vaginal bleeding, severe abdominal or pelvic pain, elevated blood pressure or other problems related to your pregnancy or postpartum time, please come to the Maternity Assessment Unit for assistance.  Fetal Movement Counts Patient Name: ________________________________________________ Patient Due Date: ____________________ What is a fetal movement count?  A fetal movement count is the number of times that you feel your baby move during a certain amount of time. This may also be called a fetal kick count. A fetal movement count is recommended for every pregnant woman. You may be asked to start counting fetal movements as early as week 28 of your pregnancy. Pay attention to when your baby is most active. You may notice your baby's sleep and wake cycles. You may also notice things that make your baby move more. You should do a fetal movement count:  When your baby is normally most active.  At the same time each day. A good time to count movements is while you are resting, after having something to eat and drink. How do I count fetal movements? 1. Find a quiet, comfortable area. Sit, or lie down on your side. 2. Write down the date, the start time and stop time, and the number of movements that you felt between those two times. Take this information with you to your health care visits. 3. Write down your start time when you feel the first movement. 4. Count  kicks, flutters, swishes, rolls, and jabs. You should feel at least 10 movements. 5. You may stop counting after you have felt 10 movements, or if you have been counting for 2 hours. Write down the stop time. 6. If you do not feel 10 movements in 2 hours, contact your health care provider for further instructions. Your health care provider may want to do additional tests to assess your baby's well-being. Contact a health care provider if:  You feel fewer than 10 movements in 2 hours.  Your baby is not moving like he or she usually does. Date: ____________ Start time: ____________ Stop time: ____________ Movements: ____________ Date: ____________ Start time: ____________ Stop time: ____________ Movements: ____________ Date: ____________ Start time: ____________ Stop time: ____________ Movements: ____________ Date: ____________ Start time: ____________ Stop time: ____________ Movements: ____________ Date: ____________ Start time: ____________ Stop time: ____________ Movements: ____________ Date: ____________ Start time: ____________ Stop time: ____________ Movements: ____________ Date: ____________ Start time: ____________ Stop time: ____________ Movements: ____________ Date: ____________ Start time: ____________ Stop time: ____________ Movements: ____________ Date: ____________ Start time: ____________ Stop time: ____________ Movements: ____________ This information is not intended to replace advice given to you by your health care provider. Make sure you discuss any questions you have with your health care provider. Document Revised: 04/13/2019 Document Reviewed: 04/13/2019 Elsevier Patient Education  2020 [employer].  Cholestasis of Pregnancy  Cholestasis refers to any condition that causes the flow of digestive fluid (bile) produced by the liver to slow down or stop. Cholestasis of pregnancy is most common toward the end of  pregnancy (thirdtrimester), but it can occur any time during  pregnancy. The condition often goes away soon after giving birth. Cholestasis may be uncomfortable, but it is usually harmless to you. However, it can be harmful to your baby. Cholestasis may increase the risk of:  Your baby being born too early (preterm delivery).  Your baby having a slow heart rate and lack of oxygen during delivery (fetal distress).  Losing your baby before delivery (stillbirth). What are the causes? The exact cause of this condition is not known, but it may be related to:  Pregnancy hormones. The gallbladder normally holds bile until you need it to help digest fat in your diet. Pregnancy hormones may cause the flow of bile to slow down and back up into your liver. Bile may then get into your bloodstream and cause cholestasis symptoms.  Changes in your genes (genetic mutations). Specifically, genes that affect how the liver releases bile. What increases the risk? You are more likely to develop this condition if:  You had cholestasis during a previous pregnancy.  You have a family history of cholestasis.  You have liver problems.  You are having multiple babies, such as twins or triplets. What are the signs or symptoms? The most common symptom of this condition is intense itching (pruritus), especially on the palms of your hands and soles of your feet. The itching can spread to the rest of your body and is often worse at night. You will not usually have a rash. Other symptoms may include:  Feeling tired.  Pain in your upper right abdomen.  Dark-colored urine.  Light-colored stools.  Poor appetite.  Yellowish discoloration of your skin and the whites of your eyes (jaundice). How is this diagnosed? This condition is diagnosed based on:  Your medical history.  A physical exam.  Blood tests. If you have an inherited risk for developing this condition, you may also have genetic testing. How is this treated? The goal of treatment is to make you comfortable  and keep your baby safe. Your health care provider may:  Prescribe medicine to reduce bile acid in your bloodstream, relieve symptoms, and help keep your baby safe.  Give you vitamin K before delivery to prevent excessive bleeding.  Check your baby frequently (fetal monitoring).  Perform regular blood tests to check your bile levels and liver function until your baby is delivered.  Recommend starting (inducing) your labor and delivery by week 36 or 37 of pregnancy, or as soon as your baby's lungs have developed enough. Follow these instructions at home:  Take over-the-counter and prescription medicines only as told by your health care provider.  Take cool baths to soothe itchy skin.  Wear comfortable, loose-fitting, cotton clothing to reduce itching.  Keep your fingernails short to prevent skin irritation from scratching.  Keep all follow-up visits and prenatal visits as told by your health care provider. This is important. Contact a health care provider if:  Your symptoms get worse, even with treatment.  You develop pain in your right side.  You have unusual swelling in your abdomen, feet, ankles, or legs.  You have a fever.  You are more thirsty than usual. Get help right away if:  You go into early labor.  You have a headache that does not go away or causes changes in vision.  You have nausea or you vomit.  You have severe pain in your abdomen or shoulders.  You have shortness of breath. Summary  Cholestasis of pregnancy is most common  toward the end of pregnancy (thirdtrimester), but it can occur any time during your pregnancy.  The condition often goes away soon after your baby is born.  The most common symptom of cholestasis of pregnancy is intense itching (pruritus), especially on the palms of your hands and soles of your feet.  This condition may be treated with medicine, frequent monitoring, or starting (inducing) labor and delivery by week 36 or 37 of  pregnancy. This information is not intended to replace advice given to you by your health care provider. Make sure you discuss any questions you have with your health care provider. Document Revised: 12/15/2018 Document Reviewed: 08/08/2016 Elsevier Patient Education  [place].    Signs and Symptoms of Labor Labor is your body's natural process of moving your baby, placenta, and umbilical cord out of your uterus. The process of labor usually starts when your baby is full-term, between 109 and 40 weeks of pregnancy. How will I know when I am close to going into labor? As your body prepares for labor and the birth of your baby, you may notice the following symptoms in the weeks and days before true labor starts:  Having a strong desire to get your home ready to receive your new baby. This is called nesting. Nesting may be a sign that labor is approaching, and it may occur several weeks before birth. Nesting may involve cleaning and organizing your home.  Passing a small amount of thick, bloody mucus out of your vagina (normal bloody show or losing your mucus plug). This may happen more than a week before labor begins, or it might occur right before labor begins as the opening of the cervix starts to widen (dilate). For some women, the entire mucus plug passes at once. For others, smaller portions of the mucus plug may gradually pass over several days.  Your baby moving (dropping) lower in your pelvis to get into position for birth (lightening). When this happens, you may feel more pressure on your bladder and pelvic bone and less pressure on your ribs. This may make it easier to breathe. It may also cause you to need to urinate more often and have problems with bowel movements.  Having "practice contractions" (Braxton Hicks contractions) that occur at irregular (unevenly spaced) intervals that are more than 10 minutes apart. This is also called false labor. False labor contractions are  common after exercise or sexual activity, and they will stop if you change position, rest, or drink fluids. These contractions are usually mild and do not get stronger over time. They may feel like: ? A backache or back pain. ? Mild cramps, similar to menstrual cramps. ? Tightening or pressure in your abdomen. Other early symptoms that labor may be starting soon include:  Nausea or loss of appetite.  Diarrhea.  Having a sudden burst of energy, or feeling very tired.  Mood changes.  Having trouble sleeping. How will I know when labor has begun? Signs that true labor has begun may include:  Having contractions that come at regular (evenly spaced) intervals and increase in intensity. This may feel like more intense tightening or pressure in your abdomen that moves to your back. ? Contractions may also feel like rhythmic pain in your upper thighs or back that comes and goes at regular intervals. ? For first-time mothers, this change in intensity of contractions often occurs at a more gradual pace. ? Women who have given birth before may notice a more rapid progression of contraction  changes.  Having a feeling of pressure in the vaginal area.  Your water breaking (rupture of membranes). This is when the sac of fluid that surrounds your baby breaks. When this happens, you will notice fluid leaking from your vagina. This may be clear or blood-tinged. Labor usually starts within 24 hours of your water breaking, but it may take longer to begin. ? Some women notice this as a gush of fluid. ? Others notice that their underwear repeatedly becomes damp. Follow these instructions at home:   When labor starts, or if your water breaks, call your health care provider or nurse care line. Based on your situation, they will determine when you should go in for an exam.  When you are in early labor, you may be able to rest and manage symptoms at home. Some strategies to try at home include: ? Breathing  and relaxation techniques. ? Taking a warm bath or shower. ? Listening to music. ? Using a heating pad on the lower back for pain. If you are directed to use heat:  Place a towel between your skin and the heat source.  Leave the heat on for 20-30 minutes.  Remove the heat if your skin turns bright red. This is especially important if you are unable to feel pain, heat, or cold. You may have a greater risk of getting burned. Get help right away if:  You have painful, regular contractions that are 5 minutes apart or less.  Labor starts before you are [redacted] weeks along in your pregnancy.  You have a fever.  You have a headache that does not go away.  You have bright red blood coming from your vagina.  You do not feel your baby moving.  You have a sudden onset of: ? Severe headache with vision problems. ? Nausea, vomiting, or diarrhea. ? Chest pain or shortness of breath. These symptoms may be an emergency. If your health care provider recommends that you go to the hospital or birth center where you plan to deliver, do not drive yourself. Have someone else drive you, or call emergency services (911 in the [place] Summary  Labor is your body's natural process of moving your baby, placenta, and umbilical cord out of your uterus.  The process of labor usually starts when your baby is full-term, between 34 and 40 weeks of pregnancy.  When labor starts, or if your water breaks, call your health care provider or nurse care line. Based on your situation, they will determine when you should go in for an exam. This information is not intended to replace advice given to you by your health care provider. Make sure you discuss any questions you have with your health care provider. Document Revised: 05/24/2017 Document Reviewed: 01/29/2017 Elsevier Patient Education  2020 [employer].

## 2019-10-20 ENCOUNTER — Encounter (HOSPITAL_COMMUNITY): Payer: Self-pay | Admitting: Obstetrics & Gynecology

## 2019-10-20 ENCOUNTER — Other Ambulatory Visit: Payer: Self-pay

## 2019-10-20 ENCOUNTER — Inpatient Hospital Stay (HOSPITAL_COMMUNITY)
Admission: AD | Admit: 2019-10-20 | Discharge: 2019-10-20 | Disposition: A | Discharge status: Home / Self Care | Payer: Medicaid Other | Attending: Obstetrics & Gynecology | Admitting: Obstetrics & Gynecology

## 2019-10-20 DIAGNOSIS — Z3689 Encounter for other specified antenatal screening: Secondary | ICD-10-CM

## 2019-10-20 DIAGNOSIS — O26853 Spotting complicating pregnancy, third trimester: Secondary | ICD-10-CM | POA: Insufficient documentation

## 2019-10-20 DIAGNOSIS — O99820 Streptococcus B carrier state complicating pregnancy: Secondary | ICD-10-CM | POA: Diagnosis not present

## 2019-10-20 DIAGNOSIS — B951 Streptococcus, group B, as the cause of diseases classified elsewhere: Secondary | ICD-10-CM

## 2019-10-20 DIAGNOSIS — O368130 Decreased fetal movements, third trimester, not applicable or unspecified: Secondary | ICD-10-CM | POA: Diagnosis present

## 2019-10-20 DIAGNOSIS — Z3A38 38 weeks gestation of pregnancy: Secondary | ICD-10-CM | POA: Insufficient documentation

## 2019-10-20 LAB — WET PREP, GENITAL
Clue Cells Wet Prep HPF POC: NONE SEEN
Sperm: NONE SEEN
Trich, Wet Prep: NONE SEEN
Yeast Wet Prep HPF POC: NONE SEEN

## 2019-10-20 NOTE — Discharge Instructions (Signed)
Fetal Movement Counts Patient Name: ________________________________________________ Patient Due Date: ____________________ What is a fetal movement count?  A fetal movement count is the number of times that you feel your baby move during a certain amount of time. This may also be called a fetal kick count. A fetal movement count is recommended for every pregnant woman. You may be asked to start counting fetal movements as early as week 28 of your pregnancy. Pay attention to when your baby is most active. You may notice your baby's sleep and wake cycles. You may also notice things that make your baby move more. You should do a fetal movement count:  When your baby is normally most active.  At the same time each day. A good time to count movements is while you are resting, after having something to eat and drink. How do I count fetal movements? 1. Find a quiet, comfortable area. Sit, or lie down on your side. 2. Write down the date, the start time and stop time, and the number of movements that you felt between those two times. Take this information with you to your health care visits. 3. Write down your start time when you feel the first movement. 4. Count kicks, flutters, swishes, rolls, and jabs. You should feel at least 10 movements. 5. You may stop counting after you have felt 10 movements, or if you have been counting for 2 hours. Write down the stop time. 6. If you do not feel 10 movements in 2 hours, contact your health care provider for further instructions. Your health care provider may want to do additional tests to assess your baby's well-being. Contact a health care provider if:  You feel fewer than 10 movements in 2 hours.  Your baby is not moving like he or she usually does. Date: ____________ Start time: ____________ Stop time: ____________ Movements: ____________ Date: ____________ Start time: ____________ Stop time: ____________ Movements: ____________ Date: ____________  Start time: ____________ Stop time: ____________ Movements: ____________ Date: ____________ Start time: ____________ Stop time: ____________ Movements: ____________ Date: ____________ Start time: ____________ Stop time: ____________ Movements: ____________ Date: ____________ Start time: ____________ Stop time: ____________ Movements: ____________ Date: ____________ Start time: ____________ Stop time: ____________ Movements: ____________ Date: ____________ Start time: ____________ Stop time: ____________ Movements: ____________ Date: ____________ Start time: ____________ Stop time: ____________ Movements: ____________ This information is not intended to replace advice given to you by your health care provider. Make sure you discuss any questions you have with your health care provider. Document Revised: 04/13/2019 Document Reviewed: 04/13/2019 Elsevier Patient Education  2020 Else[employer] Inc[employer]  

## 2019-10-20 NOTE — MAU Provider Note (Signed)
Patient Michelle Bridges is a 29 y.o. G2P1001  at [redacted]w[redacted]d here with complaints of brownish discharge at 12 noon. She also reports that the baby has not been moving as much today.   She denies any complications in this pregnancy. She had a NSVD in May, 2019. She denies any history of blood pressure issues, diabetes, preterm delivery.  History     CSN: [id number]  Arrival date and time: 10/20/19 1312   None     Chief Complaint  Patient presents with  . Decreased Fetal Movement   Vaginal Discharge The patient's primary symptoms include vaginal discharge. This is a new problem. The problem has been unchanged. Pertinent negatives include no constipation, diarrhea, dysuria, fever, urgency or vomiting. Vaginal bleeding amount: light brown, similar to start of period. She has not been passing clots.   She also says that the baby was not moving as much this morning. THe baby normally moves more in the afternoon; she did not think much of it until she saw the brown discharge. The brown discharge made her concerned so she decided to come in. She denies recent intercourse.  OB History    Gravida  2   Para  1   Term  1   Preterm      AB      Living  1     SAB      TAB      Ectopic      Multiple  0   Live Births  1           Past Medical History:  Diagnosis Date  . Eczema     Past Surgical History:  Procedure Laterality Date  . LASIK    . NO PAST SURGERIES      Family History  Problem Relation Age of Onset  . Asthma Maternal Aunt   . Diabetes Maternal Aunt   . Diabetes Paternal Grandmother   . Breast cancer Mother     Social History   Tobacco Use  . Smoking status: Never Smoker  . Smokeless tobacco: Never Used  Substance Use Topics  . Alcohol use: No  . Drug use: No    Allergies: No Known Allergies  Medications Prior to Admission  Medication Sig Dispense Refill Last Dose  . Prenatal Vit-Fe Fumarate-FA (PRENATAL MULTIVITAMIN) TABS tablet Take 1  tablet by mouth daily at 12 noon.   10/19/2019 at 1200    Review of Systems  Constitutional: Negative for fever.  Gastrointestinal: Negative for constipation, diarrhea and vomiting.  Genitourinary: Positive for vaginal discharge. Negative for dysuria and urgency.   Physical Exam   Blood pressure 106/68, pulse 83, temperature 98.6 F (37 C), temperature source Oral, resp. rate 18, height 4\' 11"  (1.499 m), weight 58.2 kg, last menstrual period 01/24/2019, SpO2 100 %, currently breastfeeding.  Physical Exam  Constitutional: She is oriented to person, place, and time. She appears well-developed.  HENT:  Head: Normocephalic.  Respiratory: Effort normal.  GI: Soft.  Genitourinary:    Vagina normal.     Genitourinary Comments: NEFG; no blood in the vagina. No discharge, no lesions, no pooling. Cervix is long, closed, thick.    Musculoskeletal:        General: Normal range of motion.     Cervical back: Normal range of motion.  Neurological: She is alert and oriented to person, place, and time.  Skin: Skin is warm and dry.    MAU Course  Procedures  MDM -she reports strong fetal  movements since she came to MAU -repeat GC CT , although negative two weeks ago.  -wet prep: negative -NST: 135 bpm, mod var, present acel, neg decels, ctx q 5.   Assessment and Plan   1. NST (non-stress test) reactive   2. Positive GBS test    2. Patient stable for discharge with education on fetal kick counts and other warning signs of third trimester. Return to MAU if she has any concerns.   3. Keep appt on the 16th on [facility].   Charlesetta Garibaldi Brookelynn Hamor 10/20/2019, 2:10 PM

## 2019-10-20 NOTE — MAU Note (Signed)
Presents with c/o decreased FM, reports has felt movement today, less than usual.  Denies VB or LOF.  Reports had milkshake only this morning.

## 2019-10-23 LAB — GC/CHLAMYDIA PROBE AMP (~~LOC~~) NOT AT ARMC
Chlamydia: NEGATIVE
Comment: NEGATIVE
Comment: NORMAL
Neisseria Gonorrhea: NEGATIVE

## 2019-10-24 ENCOUNTER — Ambulatory Visit (INDEPENDENT_AMBULATORY_CARE_PROVIDER_SITE_OTHER): Payer: Medicaid Other | Admitting: Advanced Practice Midwife

## 2019-10-24 ENCOUNTER — Other Ambulatory Visit: Payer: Self-pay

## 2019-10-24 VITALS — BP 107/72 | HR 62 | Temp 97.9°F | Wt 128.0 lb

## 2019-10-24 DIAGNOSIS — Z3A39 39 weeks gestation of pregnancy: Secondary | ICD-10-CM

## 2019-10-24 DIAGNOSIS — O99820 Streptococcus B carrier state complicating pregnancy: Secondary | ICD-10-CM

## 2019-10-24 DIAGNOSIS — B951 Streptococcus, group B, as the cause of diseases classified elsewhere: Secondary | ICD-10-CM

## 2019-10-24 DIAGNOSIS — Z3480 Encounter for supervision of other normal pregnancy, unspecified trimester: Secondary | ICD-10-CM

## 2019-10-24 NOTE — Progress Notes (Signed)
   PRENATAL VISIT NOTE  Subjective:  Michelle Bridges is a 29 y.o. G2P1001 at 104w0d being seen today for ongoing prenatal care.  She is currently monitored for the following issues for this low-risk pregnancy and has Vitamin D deficiency; Supervision of other normal pregnancy, antepartum; History of cholestasis during pregnancy; and Positive GBS test on their problem list.  Patient reports occasional contractions.  Contractions: Irregular. Vag. Bleeding: None.  Movement: Present. Denies leaking of fluid.   The following portions of the patient's history were reviewed and updated as appropriate: allergies, current medications, past family history, past medical history, past social history, past surgical history and problem list.   Objective:   Vitals:   10/24/19 1453  BP: 107/72  Pulse: 62  Temp: 97.9 F (36.6 C)  Weight: 128 lb (58.1 kg)    Fetal Status: Fetal Heart Rate (bpm): 147 Fundal Height: 39 cm Movement: Present     General:  Alert, oriented and cooperative. Patient is in no acute distress.  Skin: Skin is warm and dry. No rash noted.   Cardiovascular: Normal heart rate noted  Respiratory: Normal respiratory effort, no problems with respiration noted  Abdomen: Soft, gravid, appropriate for gestational age.  Pain/Pressure: Present     Pelvic: Cervical exam deferred        Extremities: Normal range of motion.  Edema: Trace  Mental Status: Normal mood and affect. Normal behavior. Normal judgment and thought content.   Assessment and Plan:  Pregnancy: G2P1001 at [redacted]w[redacted]d  1. Supervision of other normal pregnancy, antepartum --Anticipatory guidance about next visits/weeks of pregnancy given. --Try the [employer], EPO, intercourse to encourage onset of labor --Pt declines exam today, closed/thick on 1/12 in MAU --Next visit at 40 weeks with NST, then IOL at 41 weeks if no labor onset, scheduled and orders placed today.  2. Positive GBS test --Needs prophylaxis in  labor  Term labor symptoms and general obstetric precautions including but not limited to vaginal bleeding, contractions, leaking of fluid and fetal movement were reviewed in detail with the patient. Please refer to After Visit Summary for other counseling recommendations.   Return in about 1 week (around 10/31/2019).  Future Appointments  Date Time Provider Department Center  10/31/2019  1:40 PM Raelyn Mora, CNM [facility] None    [name], CNM

## 2019-10-24 NOTE — Patient Instructions (Addendum)
Labor Precautions Reasons to come to [facility]U at [facility]:  1.  Contractions are  5 minutes apart or less, each last 1 minute, these have been going on for 1-2 hours, and you cannot walk or talk during them 2.  You have a large gush of fluid, or a trickle of fluid that will not stop and you have to wear a pad 3.  You have bleeding that is bright red, heavier than spotting--like menstrual bleeding (spotting can be normal in early labor or after a check of your cervix) 4.  You do not feel the baby moving like he/she normally does   Things to Try After 37 weeks to Encourage Labor/Get Ready for Labor:   1.  Try the [employer] at [email or web address].[email or web address] daily to improve baby's position and encourage the onset of labor.  2. Walk a little and rest a little every day.  Change positions often.  3. Cervical Ripening: May try one or both a. Red Raspberry Leaf capsules or tea:  two 300mg  or 400mg  tablets with each meal, 2-3 times a day, or 1-3 cups of tea daily  Potential Side Effects Of Raspberry Leaf:  Most women do not experience any side effects from drinking raspberry leaf tea. However, nausea and loose stools are possible   b. Evening Primrose Oil capsules: may take 1 to 3 capsules daily. May also prick one to release the oil and insert it into your vagina at night.  Some of the potential side effects:  Upset stomach  Loose stools or diarrhea  Headaches

## 2019-10-24 NOTE — Progress Notes (Signed)
Pt presents for ROB declines cx check today.

## 2019-10-25 ENCOUNTER — Other Ambulatory Visit: Payer: Self-pay

## 2019-10-25 ENCOUNTER — Inpatient Hospital Stay (HOSPITAL_COMMUNITY)
Admission: AD | Admit: 2019-10-25 | Discharge: 2019-10-27 | DRG: 807 | Disposition: A | Discharge status: Home / Self Care | Payer: Medicaid Other | Attending: Family Medicine | Admitting: Family Medicine

## 2019-10-25 ENCOUNTER — Encounter (HOSPITAL_COMMUNITY): Payer: Self-pay | Admitting: Family Medicine

## 2019-10-25 DIAGNOSIS — B951 Streptococcus, group B, as the cause of diseases classified elsewhere: Secondary | ICD-10-CM

## 2019-10-25 DIAGNOSIS — O9882 Other maternal infectious and parasitic diseases complicating childbirth: Secondary | ICD-10-CM | POA: Diagnosis not present

## 2019-10-25 DIAGNOSIS — O26893 Other specified pregnancy related conditions, third trimester: Secondary | ICD-10-CM | POA: Diagnosis present

## 2019-10-25 DIAGNOSIS — Z3A39 39 weeks gestation of pregnancy: Secondary | ICD-10-CM | POA: Diagnosis not present

## 2019-10-25 DIAGNOSIS — O99824 Streptococcus B carrier state complicating childbirth: Secondary | ICD-10-CM | POA: Diagnosis present

## 2019-10-25 DIAGNOSIS — Z20822 Contact with and (suspected) exposure to covid-19: Secondary | ICD-10-CM | POA: Diagnosis present

## 2019-10-25 LAB — RPR: RPR Ser Ql: NONREACTIVE

## 2019-10-25 LAB — TYPE AND SCREEN
ABO/RH(D): A POS
Antibody Screen: NEGATIVE

## 2019-10-25 LAB — CBC
HCT: 41.1 % (ref 36.0–46.0)
Hemoglobin: 13.6 g/dL (ref 12.0–15.0)
MCH: 30.4 pg (ref 26.0–34.0)
MCHC: 33.1 g/dL (ref 30.0–36.0)
MCV: 91.9 fL (ref 80.0–100.0)
Platelets: 226 10*3/uL (ref 150–400)
RBC: 4.47 MIL/uL (ref 3.87–5.11)
RDW: 14.7 % (ref 11.5–15.5)
WBC: 9.2 10*3/uL (ref 4.0–10.5)
nRBC: 0 % (ref 0.0–0.2)

## 2019-10-25 LAB — RESPIRATORY PANEL BY RT PCR (FLU A&B, COVID)
Influenza A by PCR: NEGATIVE
Influenza B by PCR: NEGATIVE
SARS Coronavirus 2 by RT PCR: NEGATIVE

## 2019-10-25 LAB — ABO/RH: ABO/RH(D): A POS

## 2019-10-25 MED ORDER — OXYTOCIN 40 UNITS IN NORMAL SALINE INFUSION - SIMPLE MED
2.5000 [IU]/h | INTRAVENOUS | Status: DC
  Filled 2019-10-25: qty 1000

## 2019-10-25 MED ORDER — ONDANSETRON HCL 4 MG/2ML IJ SOLN: 4.0000 mg | INTRAMUSCULAR | Status: DC | PRN

## 2019-10-25 MED ORDER — ONDANSETRON HCL 4 MG PO TABS: 4.0000 mg | ORAL_TABLET | ORAL | Status: DC | PRN

## 2019-10-25 MED ORDER — OXYCODONE-ACETAMINOPHEN 5-325 MG PO TABS: 1.0000 | ORAL_TABLET | ORAL | Status: DC | PRN

## 2019-10-25 MED ORDER — IBUPROFEN 600 MG PO TABS
600.0000 mg | ORAL_TABLET | Freq: Four times a day (QID) | ORAL | Status: DC
  Administered 2019-10-25 – 2019-10-27 (×9): 600 mg via ORAL
  Filled 2019-10-25 (×9): qty 1

## 2019-10-25 MED ORDER — ACETAMINOPHEN 325 MG PO TABS: 650.0000 mg | ORAL_TABLET | ORAL | Status: DC | PRN

## 2019-10-25 MED ORDER — DIBUCAINE (PERIANAL) 1 % EX OINT: 1.0000 "application " | TOPICAL_OINTMENT | CUTANEOUS | Status: DC | PRN

## 2019-10-25 MED ORDER — FLEET ENEMA 7-19 GM/118ML RE ENEM: 1.0000 | ENEMA | RECTAL | Status: DC | PRN

## 2019-10-25 MED ORDER — WITCH HAZEL-GLYCERIN EX PADS: 1.0000 "application " | MEDICATED_PAD | CUTANEOUS | Status: DC | PRN

## 2019-10-25 MED ORDER — SENNOSIDES-DOCUSATE SODIUM 8.6-50 MG PO TABS
2.0000 | ORAL_TABLET | ORAL | Status: DC
  Administered 2019-10-25 – 2019-10-26 (×2): 2 via ORAL
  Filled 2019-10-25 (×2): qty 2

## 2019-10-25 MED ORDER — ZOLPIDEM TARTRATE 5 MG PO TABS: 5.0000 mg | ORAL_TABLET | Freq: Every evening | ORAL | Status: DC | PRN

## 2019-10-25 MED ORDER — LACTATED RINGERS IV SOLN: INTRAVENOUS | Status: DC

## 2019-10-25 MED ORDER — PRENATAL MULTIVITAMIN CH
1.0000 | ORAL_TABLET | Freq: Every day | ORAL | Status: DC
  Administered 2019-10-25 – 2019-10-26 (×2): 1 via ORAL
  Filled 2019-10-25 (×2): qty 1

## 2019-10-25 MED ORDER — SOD CITRATE-CITRIC ACID 500-334 MG/5ML PO SOLN: 30.0000 mL | ORAL | Status: DC | PRN

## 2019-10-25 MED ORDER — BENZOCAINE-MENTHOL 20-0.5 % EX AERO
1.0000 "application " | INHALATION_SPRAY | CUTANEOUS | Status: DC | PRN
  Administered 2019-10-25: 1 via TOPICAL
  Filled 2019-10-25: qty 56

## 2019-10-25 MED ORDER — OXYTOCIN BOLUS FROM INFUSION
500.0000 mL | Freq: Once | INTRAVENOUS | Status: AC
  Administered 2019-10-25: 500 mL via INTRAVENOUS

## 2019-10-25 MED ORDER — SIMETHICONE 80 MG PO CHEW: 80.0000 mg | CHEWABLE_TABLET | ORAL | Status: DC | PRN

## 2019-10-25 MED ORDER — ONDANSETRON HCL 4 MG/2ML IJ SOLN: 4.0000 mg | Freq: Four times a day (QID) | INTRAMUSCULAR | Status: DC | PRN

## 2019-10-25 MED ORDER — TETANUS-DIPHTH-ACELL PERTUSSIS 5-2.5-18.5 LF-MCG/0.5 IM SUSP: 0.5000 mL | Freq: Once | INTRAMUSCULAR | Status: DC

## 2019-10-25 MED ORDER — LIDOCAINE HCL (PF) 1 % IJ SOLN
30.0000 mL | INTRAMUSCULAR | Status: AC | PRN
  Administered 2019-10-25: 30 mL via SUBCUTANEOUS
  Filled 2019-10-25: qty 30

## 2019-10-25 MED ORDER — DIPHENHYDRAMINE HCL 25 MG PO CAPS: 25.0000 mg | ORAL_CAPSULE | Freq: Four times a day (QID) | ORAL | Status: DC | PRN

## 2019-10-25 MED ORDER — SODIUM CHLORIDE 0.9 % IV SOLN
2.0000 g | Freq: Once | INTRAVENOUS | Status: AC
  Administered 2019-10-25: 2 g via INTRAVENOUS
  Filled 2019-10-25: qty 2000

## 2019-10-25 MED ORDER — COCONUT OIL OIL
1.0000 "application " | TOPICAL_OIL | Status: DC | PRN
  Administered 2019-10-26: 1 via TOPICAL

## 2019-10-25 MED ORDER — LACTATED RINGERS IV SOLN: 500.0000 mL | INTRAVENOUS | Status: DC | PRN

## 2019-10-25 NOTE — MAU Note (Signed)
Patient presents with contractions < 5 minutes apart.  Denies LOF/VB.  Endorses + FM w/in the past half hour.  Denies complications w/  Her pregnancy.  GBS +

## 2019-10-25 NOTE — Plan of Care (Signed)
  Problem: Clinical Measurements: Goal: Ability to maintain clinical measurements within normal limits will improve Outcome: Progressing   Problem: Elimination: Goal: Will not experience complications related to urinary retention Outcome: Progressing Note: Pt has been voiding without difficulty.   Problem: Pain Managment: Goal: General experience of comfort will improve Outcome: Progressing Note: Pain has been managed with scheduled meds.  Pt satisfied with pain control at this time.   Problem: Activity: Goal: Ability to tolerate increased activity will improve Outcome: Progressing   Problem: Life Cycle: Goal: Chance of risk for complications during the postpartum period will decrease Outcome: Progressing Note: Lochia WNL without clots, fundas firm.   Problem: Role Relationship: Goal: Ability to demonstrate positive interaction with newborn will improve Outcome: Progressing

## 2019-10-25 NOTE — H&P (Addendum)
LABOR AND DELIVERY ADMISSION HISTORY AND PHYSICAL NOTE  Michelle Bridges is a 29 y.o. female G2P1001 with IUP at [redacted]w[redacted]d by LMP (EDD 10/31/2019) presenting for Contractions and Active Labor (cervix dilated to 6cm).  She reports positive fetal movement. She denies leakage of fluid or vaginal bleeding.  Prenatal History/Complications: PNC at [facility] Pregnancy complications:  - GBS Positive - H/o Cholestasis during Pregnancy (first one, not current)  Past Medical History: Past Medical History:  Diagnosis Date   Eczema     Past Surgical History: Past Surgical History:  Procedure Laterality Date   LASIK     NO PAST SURGERIES      Obstetrical History: OB History     Gravida  2   Para  1   Term  1   Preterm      AB      Living  1      SAB      TAB      Ectopic      Multiple  0   Live Births  1           Social History: Social History   Socioeconomic History   Marital status: Significant Other    Spouse name: Not on file   Number of children: Not on file   Years of education: Not on file   Highest education level: Not on file  Occupational History   Not on file  Tobacco Use   Smoking status: Never Smoker   Smokeless tobacco: Never Used  Substance and Sexual Activity   Alcohol use: No   Drug use: No   Sexual activity: Yes    Birth control/protection: None  Other Topics Concern   Not on file  Social History Narrative   Not on file   Social Determinants of Health   Financial Resource Strain:    Difficulty of Paying Living Expenses: Not on file  Food Insecurity:    Worried About Running Out of Food in the Last Year: Not on file   [employer] of Food in the Last Year: Not on file  Transportation Needs:    Lack of Transportation (Medical): Not on file   Lack of Transportation (Non-Medical): Not on file  Physical Activity:    Days of Exercise per Week: Not on file   Minutes of Exercise per Session: Not on file  Stress:    Feeling of Stress  : Not on file  Social Connections:    Frequency of Communication with Friends and Family: Not on file   Frequency of Social Gatherings with Friends and Family: Not on file   Attends Religious Services: Not on file   Active Member of Clubs or Organizations: Not on file   Attends Banker Meetings: Not on file   Marital Status: Not on file    Family History: Family History  Problem Relation Age of Onset   Asthma Maternal Aunt    Diabetes Maternal Aunt    Diabetes Paternal Grandmother    Breast cancer Mother     Allergies: No Known Allergies  Medications Prior to Admission  Medication Sig Dispense Refill Last Dose   Prenatal Vit-Fe Fumarate-FA (PRENATAL MULTIVITAMIN) TABS tablet Take 1 tablet by mouth daily at 12 noon.        Review of Systems  All systems reviewed and negative except as stated in HPI  Physical Exam Blood pressure 111/73, pulse 75, temperature 98.1 F (36.7 C), temperature source Oral, resp. rate 19, height 4\' 11"  (  1.499 m), weight 58.7 kg, last menstrual period 01/24/2019, SpO2 100 %, currently breastfeeding. General appearance: alert, oriented, non-toxic appearing, vomiting with contractions Lungs: normal respiratory effort Heart: RRR, S1S2 auscultated Abdomen: soft, non-tender; gravid, FH appropriate for GA Extremities: No calf swelling or tenderness Presentation: cephalic Fetal monitoring: Category 1 Dilation: 9 Effacement (%): 100 Station: -1 Exam by:: Hector Shade, RN  Prenatal labs: ABO, Rh: --/--/PENDING (02/17 0553) Antibody: PENDING (02/17 0553) Rubella: 2.95 (09/08 1434) RPR: Non Reactive (12/01 0912)  HBsAg: Negative (09/08 1434)  HIV: Non Reactive (12/01 0912)  GC/Chlamydia: Negative/Negative (10/20/2019) GBS: --Tessie Fass (01/26 1128)  2-hr GTT: 75-140-99, all wnl (08/08/2019) Genetic screening:  Declined Anatomy US: normal [place], Girl  Prenatal Transfer Tool  Maternal Diabetes: No Genetic Screening: Declined Maternal  Ultrasounds/Referrals: Normal Fetal Ultrasounds or other Referrals:  None Maternal Substance Abuse:  No  Significant Maternal Medications:  None Significant Maternal Lab Results: Group B Strep positive  Results for orders placed or performed during the hospital encounter of 10/25/19 (from the past 24 hour(s))  Respiratory Panel by RT PCR (Flu A&B, Covid) - Nasopharyngeal Swab   Collection Time: 10/25/19  5:50 AM   Specimen: Nasopharyngeal Swab  Result Value Ref Range   SARS Coronavirus 2 by RT PCR NEGATIVE NEGATIVE   Influenza A by PCR NEGATIVE NEGATIVE   Influenza B by PCR NEGATIVE NEGATIVE  Type and screen [place]   Collection Time: 10/25/19  5:53 AM  Result Value Ref Range   ABO/RH(D) PENDING    Antibody Screen PENDING    Sample Expiration      10/28/2019,2359 Performed at [facility], [place] [address] [place], [place] [address]     Patient Active Problem List   Diagnosis Date Noted   Labor and delivery, indication for care 10/25/2019   Positive GBS test 10/17/2019   History of cholestasis during pregnancy 09/05/2019   Supervision of other normal pregnancy, antepartum 04/20/2019   Vitamin D deficiency 08/26/2017    Assessment: Michelle Bridges is a 29 y.o. G2P1001 at [redacted]w[redacted]d here for Active Labor.  #Labor: Active. Expectant management. Anticipate NSVD. #Pain: Per patient request #FWB: Category 1 #ID:  GBS Positive - starting Ampicillin #MOF: Breast feeding #MOC: POPs #Circ:  N/A  [name] 10/25/2019, 7:01 AM  GME ATTESTATION:  I saw and evaluated the patient. I agree with the findings and the plan of care as documented in the resident's note.  Merilyn Baba, DO OB Fellow, [place] [facility] 10/25/2019 8:04 AM

## 2019-10-25 NOTE — Lactation Note (Addendum)
This note was copied from a baby's chart. Lactation Consultation Note  Patient Name: Michelle Bridges Date: 10/25/2019 Reason for consult: Initial assessment;Term P2, 15 hour female infant. Mom told in house interpreter # Bonnye Fava she doesn't need an interpreter  Infant had 2 voids and 2 stools since birth. Per mom, her 1st child she had latch difficulties, she used a NS and infant still did not latch well, after few days she mainly pumped and formula fed infant, she stopped pumping after 2 months due low milk supply and switch solely to formula.  Mom nipples look everted and responds well to stimulation LC assess mom doesn't need a NS, mom was given a harmony hand pump by RN.  Per mom, she feels infant is breastfeeding and latching well has breastfeed 7 times since birth but has been sleepy since having recent bath.  LC stated this can be normal, mom can continue to  breastfeed infant according to cues, 8 to 12 times within 24 hours , but do not allow infant to exceed 3 hours without breastfeeding, if infant appears to sleepy to Nurse,  do STS and then hand express to give back volume to infant.  Mom attempt to latch infant on right breast using the cross cradle position  but infant was to sleepy (reluctant to latch) at this time. Infant was given 4 mls of colostrum by spoon and went back to sleep. Mom has a medela DEBP at home.  Mom knows to call RN or LC if she has any questions, concerns or need assistance with latching infant at breast. Reviewed Baby & Me book's Breastfeeding Basics.  Mom made aware of O/P services, breastfeeding support groups, community resources, and our phone # for post-discharge questions.   Maternal Data Formula Feeding for Exclusion: No Has patient been taught Hand Expression?: Yes Does the patient have breastfeeding experience prior to this delivery?: Yes  Feeding Feeding Type: Breast Fed  LATCH Score Latch: Too sleepy or reluctant, no latch  achieved, no sucking elicited.  Audible Swallowing: None  Type of Nipple: Everted at rest and after stimulation  Comfort (Breast/Nipple): Soft / non-tender  Hold (Positioning): Assistance needed to correctly position infant at breast and maintain latch.  LATCH Score: 5  Interventions Interventions: Breast feeding basics reviewed;Skin to skin;Assisted with latch;Support pillows;Adjust position;Breast compression;Position options;Expressed milk  Lactation Tools Discussed/Used WIC Program: No   Consult Status Consult Status: Follow-up Date: 10/26/19 Follow-up type: In-patient    Danelle Earthly 10/25/2019, 10:23 PM

## 2019-10-25 NOTE — Discharge Instructions (Signed)
Parto vaginal, cuidados de puerperio Postpartum Care After Vaginal Delivery Lea esta informacin sobre cmo cuidarse desde el momento en que nazca su beb y hasta 6 a 12 semanas despus del parto (perodo del posparto). El mdico tambin podr darle instrucciones ms especficas. Comunquese con su mdico si tiene problemas o preguntas. Siga estas indicaciones en su casa: Hemorragia vaginal  Es normal tener un poco de hemorragia vaginal (loquios) despus del parto. Use un apsito sanitario para el sangrado vaginal y secrecin. ? Durante la primera semana despus del parto, la cantidad y el aspecto de los loquios a menudo es similar a las del perodo menstrual. ? Durante las siguientes semanas disminuir gradualmente hasta convertirse en una secrecin seca amarronada o amarillenta. ? En la mayora de las mujeres, los loquios se detienen completamente entre 4 a 6semanas despus del parto. Los sangrados vaginales pueden variar de mujer a mujer.  Cambie los apsitos sanitarios con frecuencia. Observe si hay cambios en el flujo, como: ? Un aumento repentino en el volumen. ? Cambio en el color. ? Cogulos sanguneos grandes.  Si expulsa un cogulo de sangre por la vagina, gurdelo y llame al mdico para informrselo. No deseche los cogulos de sangre por el inodoro antes de hablar con su mdico.  No use tampones ni se haga duchas vaginales hasta que el mdico la autorice.  Si no est amamantando, volver a tener su perodo entre 6 y 8 semanas despus del parto. Si solamente alimenta al beb con leche materna (lactancia materna exclusiva), podra no volver a tener su perodo hasta que deje de amamantar. Cuidados perineales  Mantenga la zona entre la vagina y el ano (perineo) limpia y seca, como se lo haya indicado el mdico. Utilice apsitos o aerosoles analgsicos y cremas, como se lo hayan indicado.  Si le hicieron un corte en el perineo (episiotoma) o tuvo un desgarro en la vagina, controle la  zona para detectar signos de infeccin hasta que sane. Est atenta a los siguientes signos: ? Aumento del enrojecimiento, la hinchazn o el dolor. ? Presenta lquido o sangre que supura del corte o desgarro. ? Calor. ? Pus o mal olor.  Es posible que le den una botella rociadora para que use en lugar de limpiarse el rea con papel higinico despus de usar el bao. Cuando comience a sanar, podr usar la botella rociadora antes de secarse. Asegrese de secarse suavemente.  Para aliviar el dolor causado por una episiotoma, un desgarro en la vagina o venas hinchadas en el ano (hemorroides), trate de tomar un bao de asiento tibio 2 o 3 veces por da. Un bao de asiento es un bao de agua tibia que se toma mientras se est sentado. El agua solo debe llegar hasta las caderas y cubrir las nalgas. Cuidado de las mamas  En los primeros das despus del parto, las mamas pueden sentirse pesadas, llenas e incmodas (congestin mamaria). Tambin puede escaparse leche de sus senos. El mdico puede sugerirle mtodos para aliviar este malestar. La congestin mamaria debera desaparecer al cabo de unos das.  Si est amamantando: ? Use un sostn que sujete y ajuste bien sus pechos. ? Mantenga los pezones secos y limpios. Aplquese cremas y ungentos, como se lo haya indicado el mdico. ? Es posible que deba usar discos de algodn en el sostn para absorber la leche que se filtre de sus senos. ? Puede tener contracciones uterinas cada vez que amamante durante varias semanas despus del parto. Las contracciones uterinas ayudan al tero a   regresar a su tamao habitual. ? Si tiene algn problema con la lactancia materna, colabore con el mdico o un asesor en lactancia.  Si no est amamantando: ? Evite tocarse mucho las mamas. Al hacerlo, podran producir ms leche. ? Use un sostn que le proporcione el ajuste correcto y compresas fras para reducir la hinchazn. ? No extraiga (saque) leche materna. Esto har que  produzca ms leche. Intimidad y sexualidad  Pregntele al mdico cundo puede retomar la actividad sexual. Esto puede depender de lo siguiente: ? Su riesgo de sufrir infecciones. ? La rapidez con la que est sanando. ? Su comodidad y deseo de retomar la actividad sexual.  Despus del parto, puede quedar embarazada incluso si no ha tenido todava su perodo. Si lo desea, hable con el mdico acerca de los mtodos de control de la natalidad (mtodos anticonceptivos). Medicamentos  Tome los medicamentos de venta libre y los recetados solamente como se lo haya indicado el mdico.  Si le recetaron un antibitico, tmelo como se lo haya indicado el mdico. No deje de tomar el antibitico aunque comience a sentirse mejor. Actividad  Retome sus actividades normales de a poco como se lo haya indicado el mdico. Pregntele al mdico qu actividades son seguras para usted.  Descanse todo lo que pueda. Trate de descansar o tomar una siesta mientras el beb duerme. Comida y bebida   Beba suficiente lquido como para mantener la orina de color amarillo plido.  Coma alimentos ricos en fibras todos los das. Estos pueden ayudarla a prevenir o aliviar el estreimiento. Los alimentos ricos en fibras incluyen, entre otros: ? Panes y cereales integrales. ? Arroz integral. ? Frijoles. ? Frutas y verduras frescas.  No intente perder de peso rpidamente reduciendo el consumo de caloras.  Tome sus vitaminas prenatales hasta la visita de seguimiento de posparto o hasta que su mdico le indique que puede dejar de tomarlas. Estilo de vida  No consuma ningn producto que contenga nicotina o tabaco, como cigarrillos y cigarrillos electrnicos. Si necesita ayuda para dejar de fumar, consulte al mdico.  No beba alcohol, especialmente si est amamantando. Instrucciones generales  Concurra a todas las visitas de seguimiento para usted y el beb, como se lo haya indicado el mdico. La mayora de las mujeres  visita al mdico para un seguimiento de posparto dentro de las primeras 3 a 6 semanas despus del parto. Comunquese con un mdico si:  Se siente incapaz de controlar los cambios que implica tener un hijo y esos sentimientos no desaparecen.  Siente tristeza o preocupacin de forma inusual.  Las mamas se ponen rojas, le duelen o se endurecen.  Tiene fiebre.  Tiene dificultad para retener la orina o para impedir que la orina se escape.  Tiene poco inters o falta de inters en actividades que solan gustarle.  No ha amamantado nada y no ha tenido un perodo menstrual durante 12 semanas despus del parto.  Dej de amamantar al beb y no ha tenido su perodo menstrual durante 12 semanas despus de dejar de amamantar.  Tiene preguntas sobre su cuidado y el del beb.  Elimina un cogulo de sangre grande por la vagina. Solicite ayuda de inmediato si:  Siente dolor en el pecho.  Tiene dificultad para respirar.  Tiene un dolor repentino e intenso en la pierna.  Tiene dolor intenso o clicos en el la parte inferior del abdomen.  Tiene una hemorragia tan intensa de la vagina que empapa ms de un apsito en una hora. El sangrado   no debe ser ms abundante que el perodo ms intenso que haya tenido.  Dolor de cabeza intenso.  Se desmaya.  Tiene visin borrosa o manchas en la vista.  Tiene secrecin vaginal con mal olor.  Tiene pensamientos acerca de lastimarse a usted misma o a su beb. Si alguna vez siente que puede lastimarse a usted misma o a otras personas, o tiene pensamientos de poner fin a su vida, busque ayuda de inmediato. Puede dirigirse al departamento de emergencias ms cercano o llamar a:  El servicio de emergencias de su localidad (911 en [place]  Una lnea de asistencia al suicida y atencin en crisis, como la Lnea Nacional de Prevencin del Suicidio (National Suicide Prevention Lifeline), al 1-800-273-8255. Est disponible las 24 horas del da. Resumen  El  perodo de tiempo justo despus el parto y hasta 6 a 12 semanas despus del parto se denomina perodo posparto.  Retome sus actividades normales de a poco como se lo haya indicado el mdico.  Concurra a todas las visitas de seguimiento para usted y el beb, como se lo haya indicado el mdico. Esta informacin no tiene como fin reemplazar el consejo del mdico. Asegrese de hacerle al mdico cualquier pregunta que tenga. Document Revised: 12/04/2017 Document Reviewed: 08/15/2017 Elsevier Patient Education  2020 [employer].  

## 2019-10-25 NOTE — Discharge Summary (Signed)
  Postpartum Discharge Summary       Patient Name: Michelle Bridges DOB: 07/23/1991 MRN: [id number]  Date of admission: 10/25/2019 Delivering Provider: ANDERSON, HANNAH C   Date of discharge: 10/27/2019  Admitting diagnosis: Labor and delivery, indication for care [O75.9] Intrauterine pregnancy: [redacted]w[redacted]d     Secondary diagnosis:  Principal Problem:   Labor and delivery, indication for care  Additional problems: None     Discharge diagnosis: Term Pregnancy Delivered                                                                                                Post partum procedures:none  Augmentation: AROM  Complications: None  Hospital course:  Onset of Labor With Vaginal Delivery     29 y.o. yo G2P1001 at [redacted]w[redacted]d was admitted in Active Labor on 10/25/2019. Patient had an uncomplicated labor course as follows:   She progressed without augmentation to complete. She was AROMed and delivered shortly after.  Membrane Rupture Time/Date: 7:16 AM ,10/25/2019   Intrapartum Procedures: Episiotomy: None [1]                                         Lacerations:  2nd degree [3]  Patient had a delivery of a Viable infant. 10/25/2019  Information for the patient's newborn:  Lafon, Girl Yaritsa [[id number]]  Delivery Method: Vag-Spont     Pateint had an uncomplicated postpartum course.  She is ambulating, tolerating a regular diet, passing flatus, and urinating well. Patient is discharged home in stable condition on 10/27/19.  Delivery time: 7:21 AM    Magnesium Sulfate received: No BMZ received: No Rhophylac:N/A MMR:No Transfusion:No  Physical exam  Vitals:   10/26/19 0554 10/26/19 1400 10/26/19 2106 10/27/19 0556  BP: (!) 102/56 103/70 112/71 117/69  Pulse: (!) 57 (!) 55 66 (!) 50  Resp: 14 16 18 18  Temp: 97.7 F (36.5 C) 98 F (36.7 C) 98.5 F (36.9 C) 97.9 F (36.6 C)  TempSrc: Oral Oral Oral Oral  SpO2:   99% 99%  Weight:      Height:       General:  alert, cooperative and no distress Lochia: appropriate Uterine Fundus: firm Incision: N/A DVT Evaluation: No evidence of DVT seen on physical exam. Negative Homan's sign. No cords or calf tenderness. Labs: Lab Results  Component Value Date   WBC 10.7 (H) 10/26/2019   HGB 11.2 (L) 10/26/2019   HCT 33.7 (L) 10/26/2019   MCV 90.6 10/26/2019   PLT 161 10/26/2019   CMP Latest Ref Rng & Units 11/15/2017  Glucose 65 - 99 mg/dL 73  BUN 6 - 20 mg/dL 8  Creatinine 0.57 - 1.00 mg/dL 0.53(L)  Sodium 134 - 144 mmol/L 141  Potassium 3.5 - 5.2 mmol/L 4.2  Chloride 96 - 106 mmol/L 102  CO2 20 - 29 mmol/L 19(L)  Calcium 8.7 - 10.2 mg/dL 9.3  Total Protein 6.0 - 8.5 g/dL 6.5  Total Bilirubin 0.0 - 1.2 mg/dL <0.2  Alkaline Phos 39 -   117 IU/L 114  AST 0 - 40 IU/L 26  ALT 0 - 32 IU/L 20   Edinburgh Score: Edinburgh Postnatal Depression Scale Screening Tool 10/25/2019  I have been able to laugh and see the funny side of things. 0  I have looked forward with enjoyment to things. 0  I have blamed myself unnecessarily when things went wrong. 1  I have been anxious or worried for no good reason. 0  I have felt scared or panicky for no good reason. 0  Things have been getting on top of me. 0  I have been so unhappy that I have had difficulty sleeping. 0  I have felt sad or miserable. 0  I have been so unhappy that I have been crying. 0  The thought of harming myself has occurred to me. 0  Edinburgh Postnatal Depression Scale Total 1    Discharge instruction: per After Visit Summary and "Baby and Me Booklet".  After visit meds:  Allergies as of 10/27/2019   No Known Allergies     Medication List    TAKE these medications   ibuprofen 600 MG tablet Commonly known as: ADVIL Take 1 tablet (600 mg total) by mouth every 6 (six) hours.   norethindrone 0.35 MG tablet Commonly known as: MICRONOR Start on the Sunday after baby turns 3 weeks old   prenatal multivitamin Tabs tablet Take 1  tablet by mouth at bedtime.       Diet: routine diet  Activity: Advance as tolerated. Pelvic rest for 6 weeks.   Outpatient follow up:4 weeks Follow up Appt: Future Appointments  Date Time Provider Department Center  11/27/2019  1:20 PM Leftwich-Kirby, Lisa A, CNM [facility] None   Follow up Visit: Follow-up Information    [facility] Follow up on 11/27/2019.   Specialty: Obstetrics and Gynecology Why: for postpartum checkup Contact information: [address], [address] [place] [address] [phone]         Please schedule this patient for Postpartum visit in: 4 weeks with the following provider: Any provider For C/S patients schedule nurse incision check in weeks 2 weeks: no Low risk pregnancy complicated by: None Delivery mode:  SVD Anticipated Birth Control:  POPs PP Procedures needed: None  Schedule Integrated BH visit: no  Newborn Data: Live born female  Birth Weight: 6# 12 oz   APGAR: 8, 9  Newborn Delivery   Birth date/time: 10/25/2019 07:21:00 Delivery type: Vaginal, Spontaneous      Baby Feeding: Breast Disposition:home with mother   10/27/2019 Frances Cresenzo-Dishmon, CNM   

## 2019-10-26 LAB — CBC
HCT: 33.7 % — ABNORMAL LOW (ref 36.0–46.0)
Hemoglobin: 11.2 g/dL — ABNORMAL LOW (ref 12.0–15.0)
MCH: 30.1 pg (ref 26.0–34.0)
MCHC: 33.2 g/dL (ref 30.0–36.0)
MCV: 90.6 fL (ref 80.0–100.0)
Platelets: 161 10*3/uL (ref 150–400)
RBC: 3.72 MIL/uL — ABNORMAL LOW (ref 3.87–5.11)
RDW: 15 % (ref 11.5–15.5)
WBC: 10.7 10*3/uL — ABNORMAL HIGH (ref 4.0–10.5)
nRBC: 0 % (ref 0.0–0.2)

## 2019-10-26 MED ORDER — IBUPROFEN 600 MG PO TABS: 600.0000 mg | ORAL_TABLET | Freq: Four times a day (QID) | ORAL | 0 refills | Status: DC

## 2019-10-26 MED ORDER — NORETHINDRONE 0.35 MG PO TABS: ORAL_TABLET | ORAL | 11 refills | Status: DC

## 2019-10-26 NOTE — Progress Notes (Signed)
POSTPARTUM PROGRESS NOTE  Subjective: Michelle Bridges is a 29 y.o. [id number] PPD#1 s/p NSVD at [redacted]w[redacted]d.  She reports she doing well. No acute events overnight. She denies any problems with ambulating, voiding or po intake. Denies nausea or vomiting. She has passed flatus. Pain is well controlled.  Lochia is appropriate.  Objective: Blood pressure (!) 102/56, pulse (!) 57, temperature 97.7 F (36.5 C), temperature source Oral, resp. rate 14, height 4\' 11"  (1.499 m), weight 58.7 kg, last menstrual period 01/24/2019, SpO2 99 %, unknown if currently breastfeeding.  Physical Exam:  General: alert, cooperative and no distress Chest: no respiratory distress Abdomen: soft, non-tender  Uterine Fundus: firm, appropriately tender Extremities: No calf swelling or tenderness  No edema  Recent Labs    10/25/19 0553 10/26/19 0617  HGB 13.6 11.2*  HCT 41.1 33.7*    Assessment/Plan: Michelle Bridges is a 29 y.o. 30 PPD#1 s/p NSVD at [redacted]w[redacted]d.  Routine Postpartum Care: Doing well, pain well-controlled.  -- Continue routine care, lactation support  -- Contraception: pills -- Feeding: breast  Dispo: Plan for discharge tomorrow.  [redacted]w[redacted]d, DO OB/GYN Fellow, [facility]

## 2019-10-26 NOTE — Lactation Note (Addendum)
This note was copied from a baby's chart. Lactation Consultation Note  Patient Name: Michelle Bridges Date: 10/26/2019 Reason for consult: Follow-up assessment;Term;Infant weight loss P2, 37 hours female infant with -.4% weight loss.  Infant had 4 stools and 3 voids. Per parents, infant is no longer sleepy and latching well most feedings are now 25-30 minutes and mom feels infant latches well she has no concerns. Mom has used lanolin cream mom will switch to coconut oil that will be given by RN.  Mom knows to call RN or LC if she has any questions, concerns or needs any latch assistance.  Maternal Data    Feeding Feeding Type: Breast Fed  LATCH Score                   Interventions    Lactation Tools Discussed/Used     Consult Status Consult Status: Follow-up Date: 10/27/19 Follow-up type: In-patient    Danelle Earthly 10/26/2019, 8:50 PM

## 2019-10-27 NOTE — Lactation Note (Signed)
This note was copied from a baby's chart. Lactation Consultation Note  Mother is a P2, infant is hours 2 hours old and is now at 6% wt loss.   Mother reports that infant is feeding well, but reports that her nipples are sore. She has a scab on the rt nipple and a fld filled blister.  Reviewed hand expression with mother. Observed large drops of colostrum. Mother was given a harmony hand pump with instructions. Mothers nipples are erect with compressible breast tissue.  Assist mother with placing infant on the left breast in cross cradle hold. Infant latched well with good depth. Mother advised to flange infants lips for wide gape. Mother reports that latch feels much better. Observed infant with audible swallows and steady pattern of suckling.  Treatment and prevention of engorgement discussed.    Mother to continue to cue base feed infant and feed at least 8-12 times or more in 24 hours and advised to allow for cluster feeding infant as needed.   Mother to continue to due STS. Mother is aware of available LC services at [facility], BFSG'S, OP Dept, and phone # for questions or concerns about breastfeeding.  Mother receptive to all teaching and plan of care.    Patient Name: Michelle Bridges PJASN'K Date: 10/27/2019 Reason for consult: Follow-up assessment   Maternal Data    Feeding Feeding Type: Breast Fed  LATCH Score Latch: Grasps breast easily, tongue down, lips flanged, rhythmical sucking.  Audible Swallowing: Spontaneous and intermittent  Type of Nipple: Everted at rest and after stimulation  Comfort (Breast/Nipple): Filling, red/small blisters or bruises, mild/mod discomfort  Hold (Positioning): Assistance needed to correctly position infant at breast and maintain latch.  LATCH Score: 8  Interventions Interventions: Assisted with latch;Skin to skin;Hand express;Breast compression;Adjust position;Support pillows;Position options;Comfort gels;Hand pump  Lactation  Tools Discussed/Used     Consult Status Consult Status: Complete    Michel Bickers 10/27/2019, 9:34 AM

## 2019-10-31 ENCOUNTER — Encounter: Payer: Medicaid Other | Admitting: Obstetrics and Gynecology

## 2019-11-27 ENCOUNTER — Telehealth (INDEPENDENT_AMBULATORY_CARE_PROVIDER_SITE_OTHER): Payer: Medicaid Other | Admitting: Advanced Practice Midwife

## 2019-11-27 ENCOUNTER — Encounter: Payer: Self-pay | Admitting: Advanced Practice Midwife

## 2019-11-27 NOTE — Progress Notes (Signed)
..      I connected with@ on 11/29/19 at  1:20 PM EDT  and verified that I am speaking with the correct person using two identifiers.  Patient is located at home and provider is located at [facility].     The purpose of this virtual visit is to provide medical care while limiting exposure to the novel coronavirus. I discussed the limitations, risks, security and privacy concerns of performing an evaluation and management service and the availability of in person appointments. I also discussed with the patient that there may be a patient responsible charge related to this service. By engaging in this virtual visit, you consent to the provision of healthcare.  Additionally, you authorize for your insurance to be billed for the services provided during this visit.  The patient expressed understanding and agreed to proceed.    Post Partum Visit Note Subjective:   Michelle Bridges is a 29 y.o. [phone] female being evaluated for postpartum followup.  She is 4 weeks postpartum following a normal spontaneous vaginal delivery at  39.1 gestational weeks.  I have fully reviewed the prenatal and intrapartum course; pregnancy complicated by good.  Postpartum course has been good. Baby is doing well. Baby is feeding by breast. Bleeding staining only. Bowel function is normal. Bladder function is normal. Patient is not sexually active. Contraception method is abstinence. Postpartum depression screening: negative.  The following portions of the patient's history were reviewed and updated as appropriate: allergies, current medications, past family history, past medical history, past social history, past surgical history and problem list.  Review of Systems Pertinent items noted in HPI and remainder of comprehensive ROS otherwise negative.   Objective:  There were no vitals filed for this visit. Self-Obtained       Assessment:   1. Postpartum examination following vaginal delivery   Plan:  Essential  components of care per ACOG recommendations:  1.  Mood and well being: Patient with negative depression screening today. Reviewed local resources for support.  - Patient does not use tobacco.  - hx of drug use? No     2. Infant care and feeding:  -Patient currently breastmilk feeding? Yes If breastmilk feeding discussed return to work and pumping. If needed, patient was provided letter for work to allow for every 2-3 hr pumping breaks, and to be granted a private location to express breastmilk and refrigerated area to store breastmilk. Reviewed importance of draining breast regularly to support lactation. -Social determinants of health (SDOH) reviewed in EPIC. No concerns    3. Sexuality, contraception and birth spacing - Patient does not want a pregnancy in the next year.  Desired family size is 2-3 children.  - Reviewed forms of contraception in tiered fashion. Patient desired oral progesterone-only contraceptive today.   - Discussed birth spacing of 18 months  4. Sleep and fatigue -Encouraged family/partner/community support of 4 hrs of uninterrupted sleep to help with mood and fatigue  5. Physical Recovery  - Discussed patients delivery and complications - Patient had a 2nd degree laceration, perineal healing reviewed. Patient expressed understanding - Patient has urinary incontinence? No - Patient is safe to resume physical and sexual activity  6.  Health Maintenance - Last pap smear done 05/16/2019 and was normal with negative HPV.   7. Chronic Disease, N/A   10 minutes of non-face-to-face time spent with the patient    [name], CNM [facility] for [employer], [facility]

## 2019-12-11 IMAGING — US US MFM FETAL BPP W/O NON-STRESS
1 series · 13 of 28 positions shown · non-contrast
Comparison: none

[Series 1: us mfm fetal bpp w/o non-stress · 65 acquisitions, 13 frames shown]
[im 3/65]
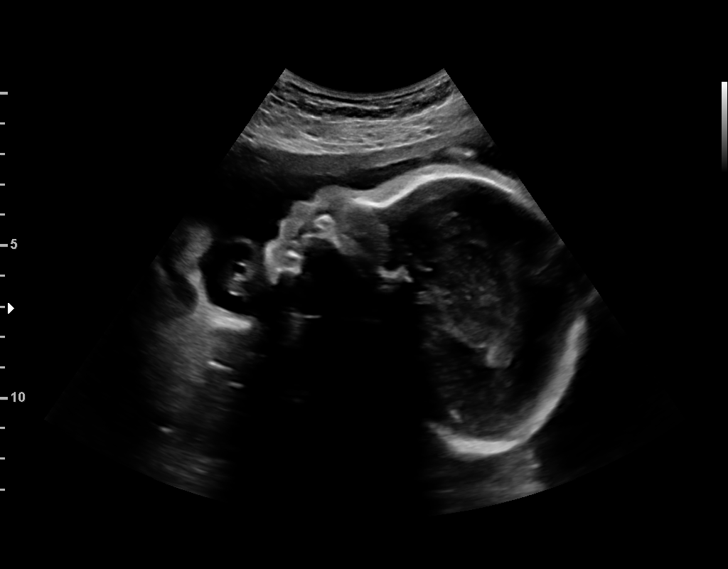
[im 8/65]
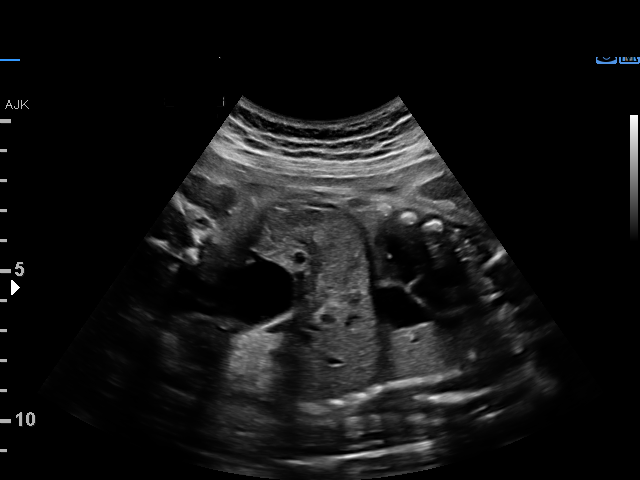
[im 12/65]
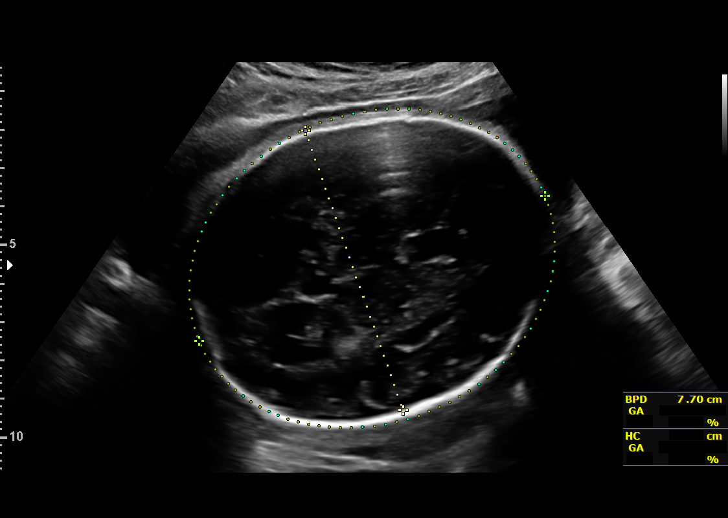
[im 17/65]
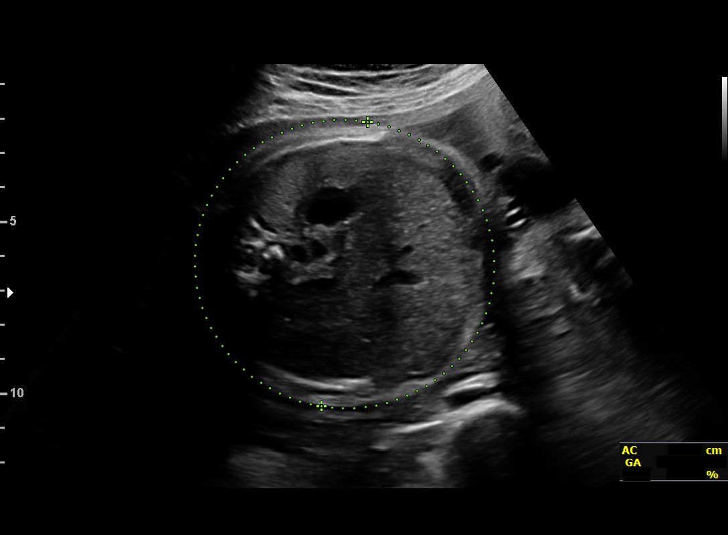
[im 22/65]
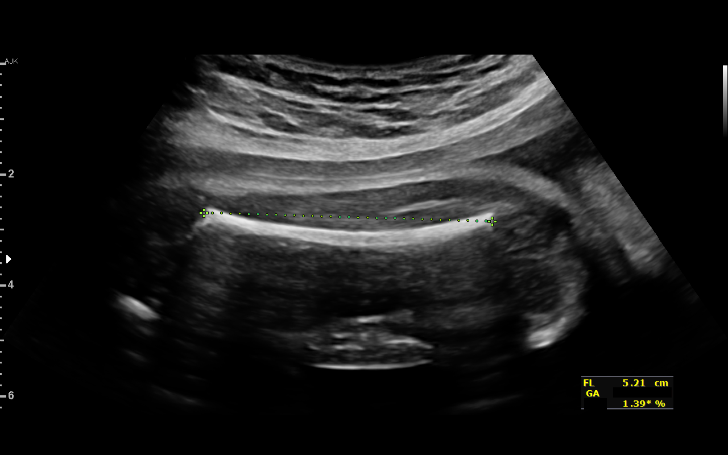
[im 27/65]
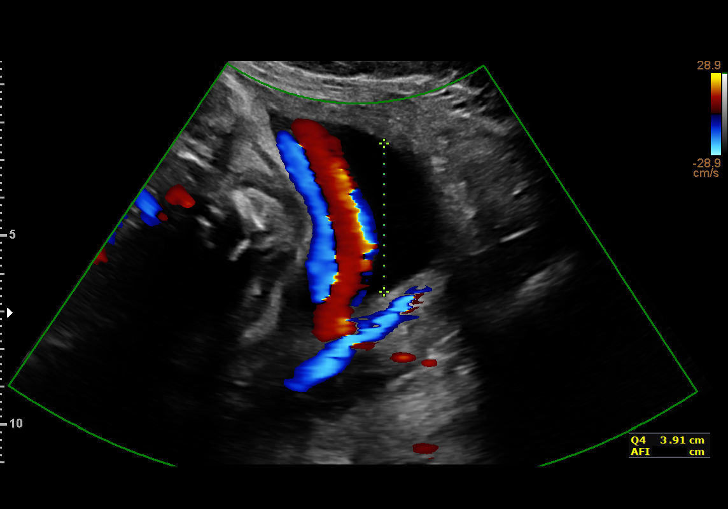
[im 34/65]
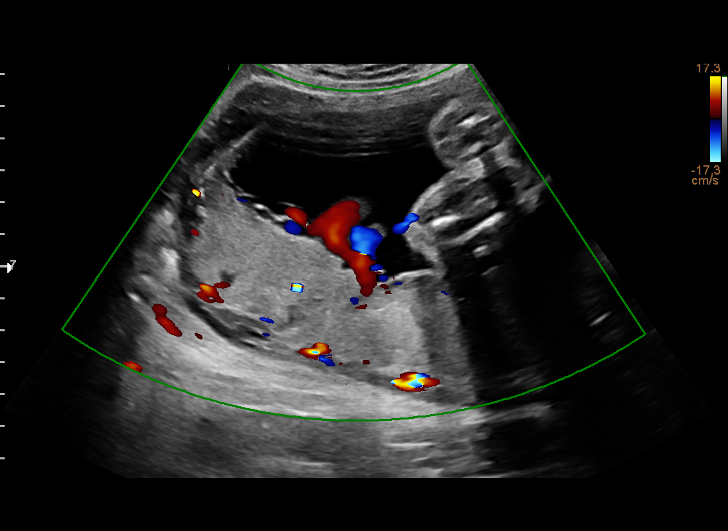
[im 38/65]
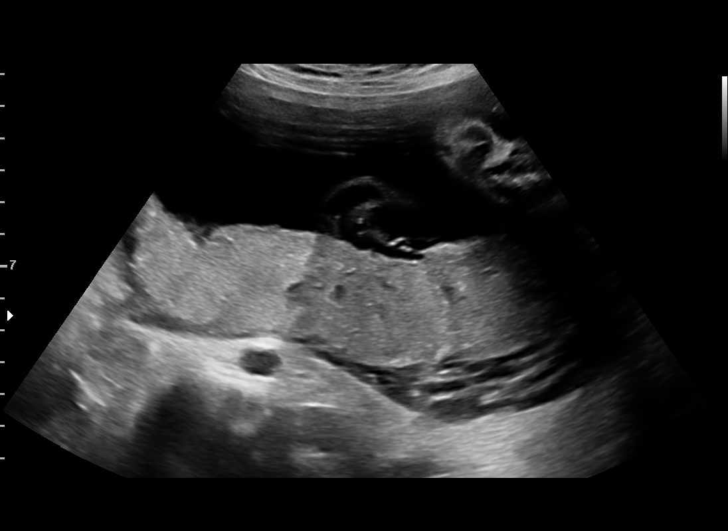
[im 43/65]
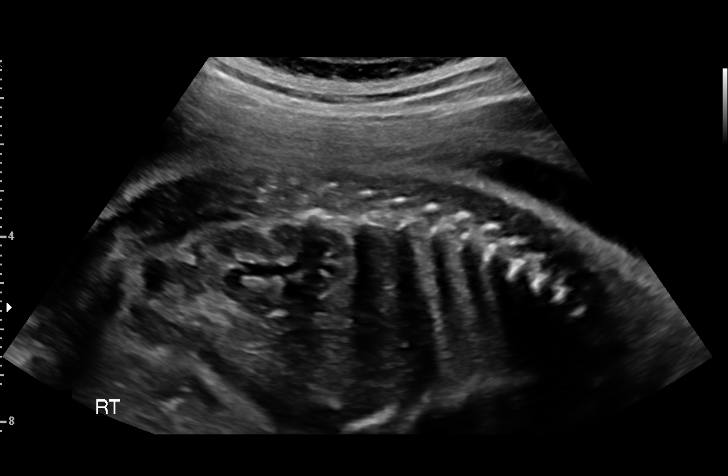
[im 48/65]
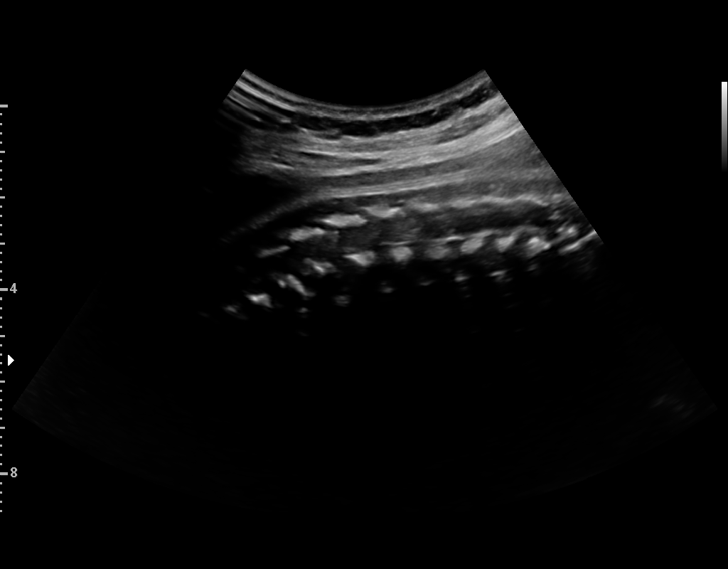
[im 53/65]
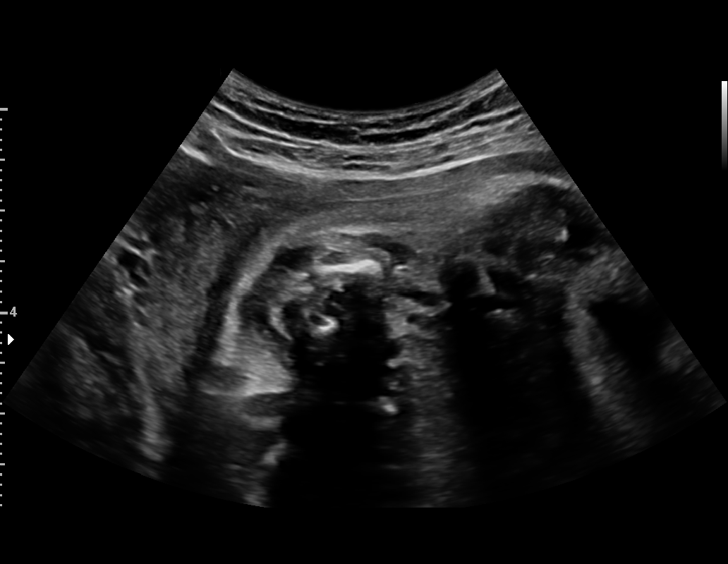
[im 57/65]
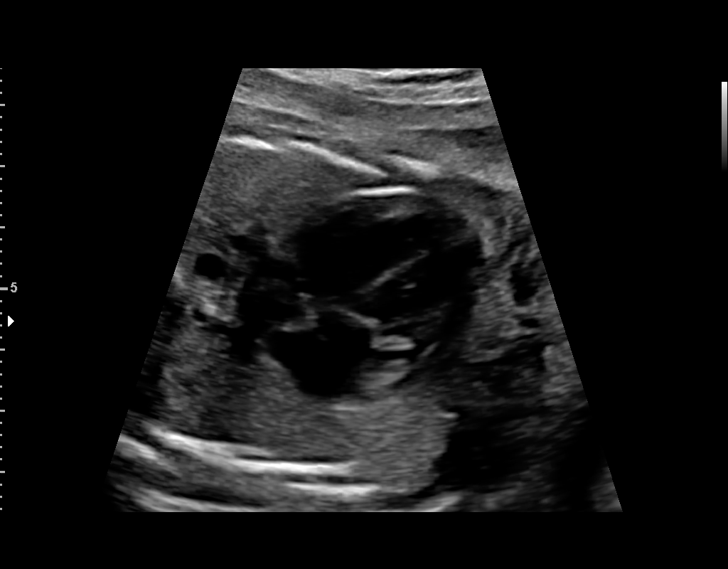
[im 62/65]
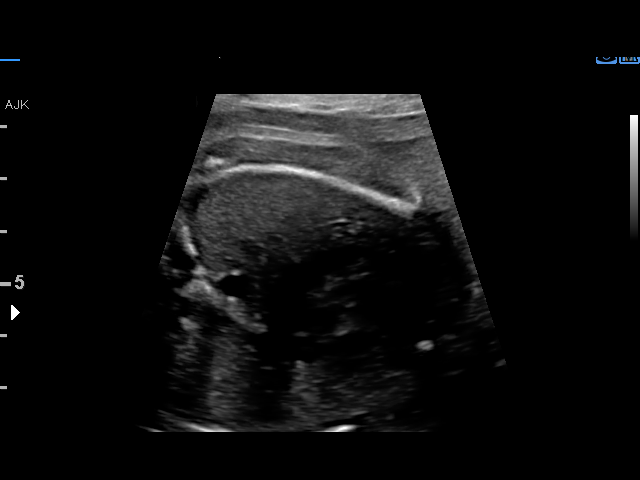

[13 of 28 positions shown; findings below may reference images not displayed]

[REDACTED] [HOSPITAL]

1  ATOM           [id number]      [id number]     [id number]
2  CHAI TIGER           [id number]      [id number]     [id number]
Indications

30 weeks gestation of pregnancy
Cholestasis of pregnancy, third trimester      [id number]
OB History

Gravidity:    1
Fetal Evaluation

Num Of Fetuses:     1
Fetal Heart         149
Rate(bpm):
Cardiac Activity:   Observed
Presentation:       Cephalic
Placenta:           Posterior, above cervical os
P. Cord Insertion:  Visualized, central

Amniotic Fluid
AFI FV:      Subjectively within normal limits

AFI Sum(cm)     %Tile       Largest Pocket(cm)
17.36           64

RUQ(cm)       RLQ(cm)       LUQ(cm)        LLQ(cm)
4.84

Comment:    Breathing noted intermittently, but not sustained.
Biophysical Evaluation

Amniotic F.V:   Within normal limits       F. Tone:        Observed
F. Movement:    Observed                   N.S.T:          Reactive
F. Breathing:   Not Observed               Score:          [DATE]
Biometry

BPD:      77.2  mm     G. Age:  31w 0d         65  %    CI:         77.5   %    70 - 86
FL/HC:      18.9   %    19.2 -
HC:      277.6  mm     G. Age:  30w 3d         22  %    HC/AC:      1.03        0.99 -
AC:      269.9  mm     G. Age:  31w 1d         73  %    FL/BPD:     68.0   %    71 - 87
FL:       52.5  mm     G. Age:  28w 0d        < 3  %    FL/AC:      19.5   %    20 - 24

Est. FW:    5503  gm      3 lb 5 oz     51  %
Gestational Age

LMP:           30w 1d        Date:  05/03/17                 EDD:   02/07/18
U/S Today:     30w 1d                                        EDD:   02/07/18
Best:          30w 1d     Det. By:  LMP  (05/03/17)          EDD:   02/07/18
Anatomy

Cranium:               Appears normal         Aortic Arch:            Previously seen
Cavum:                 Appears normal         Ductal Arch:            Previously seen
Ventricles:            Appears normal         Diaphragm:              Appears normal
Choroid Plexus:        Previously seen        Stomach:                Appears normal, left
sided
Cerebellum:            Previously seen        Abdomen:                Appears normal
Posterior Fossa:       Previously seen        Abdominal Wall:         Previously seen
Nuchal Fold:           Not applicable (>20    Cord Vessels:           Previously seen
wks GA)
Face:                  Orbits and profile     Kidneys:                Appear normal
previously seen
Lips:                  Previously seen        Bladder:                Appears normal
Thoracic:              Appears normal         Spine:                  Appears normal
Heart:                 Appears normal         Upper Extremities:      Previously seen
(4CH, axis, and
situs)
RVOT:                  Appears normal         Lower Extremities:      Previously seen
LVOT:                  Appears normal

Other:  Fetus appears to be a male. Open hands previously visualized. Heels
and 5th digit previously visualized. Technically difficult due to fetal
position.
Impression

Single living intrauterine pregnancy at 30w 1d, cholestasis of
pregnancy
Cephalic presentation.
Placenta Posterior, above cervical os.
Normal amniotic fluid volume.
Appropriate interval fetal growth.
Normal interval fetal anatomy.
Recommendations

Continue serial growth and antenatal testing until delivery.

## 2019-12-24 IMAGING — US US MFM FETAL BPP W/O NON-STRESS
1 series · 14 of 28 positions shown · non-contrast
Comparison: none

[Series 1: us mfm fetal bpp w/o non-stress · 28 acquisitions, 14 frames shown]
[im 2/28]
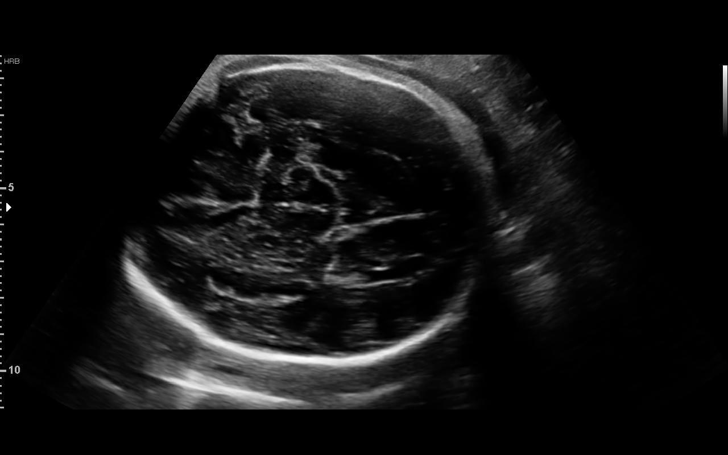
[im 4/28]
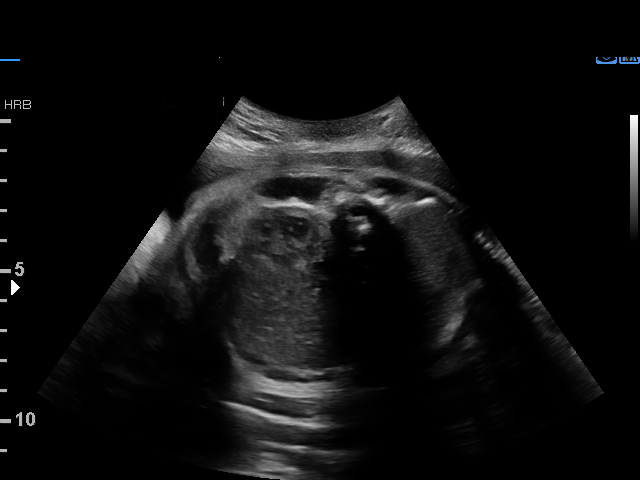
[im 6/28]
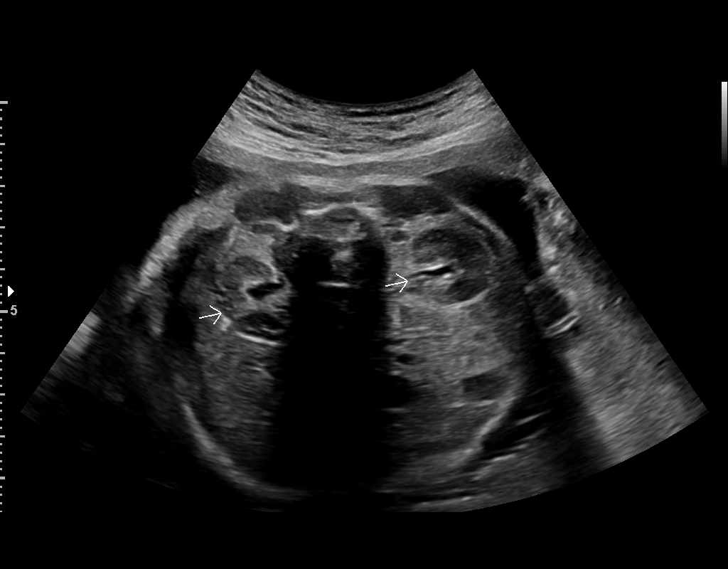
[im 8/28]
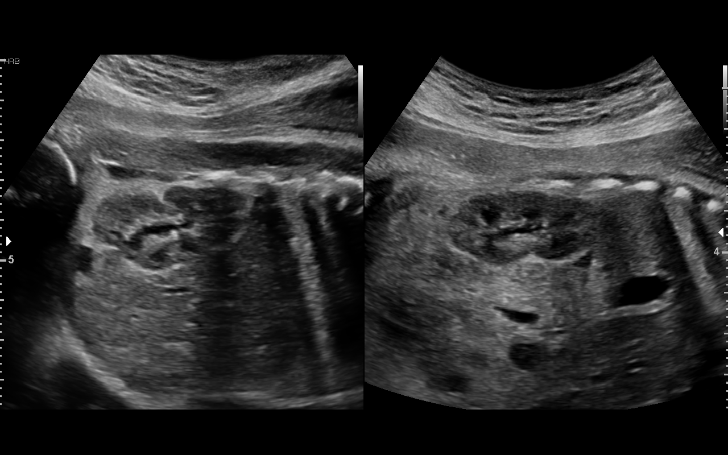
[im 10/28]
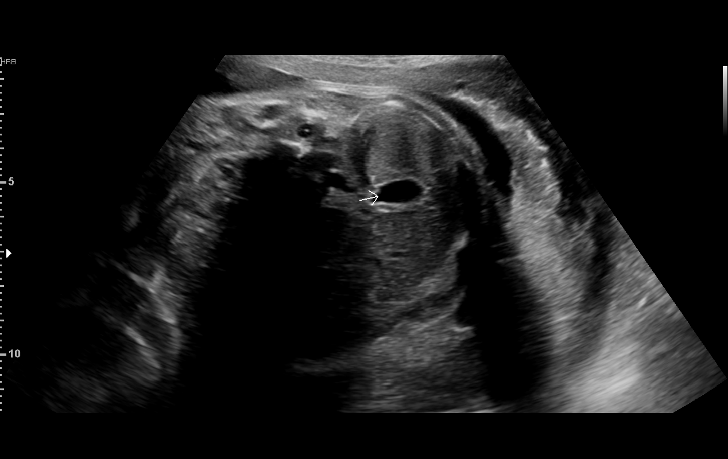
[im 12/28]
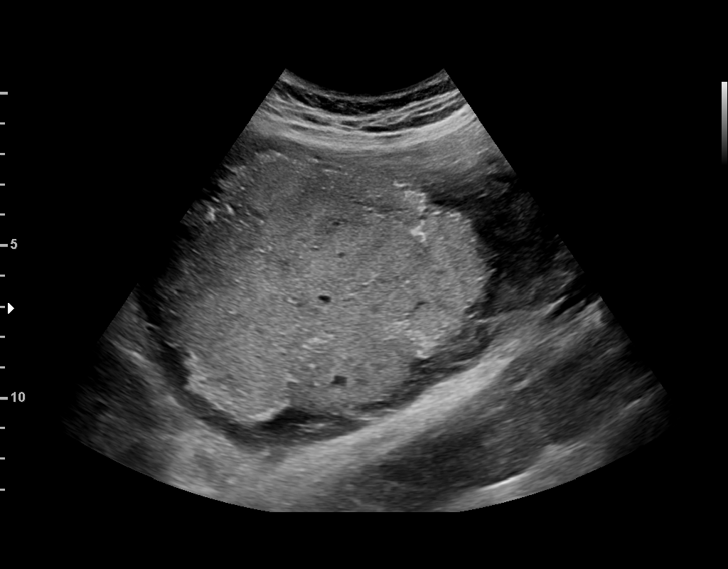
[im 14/28]
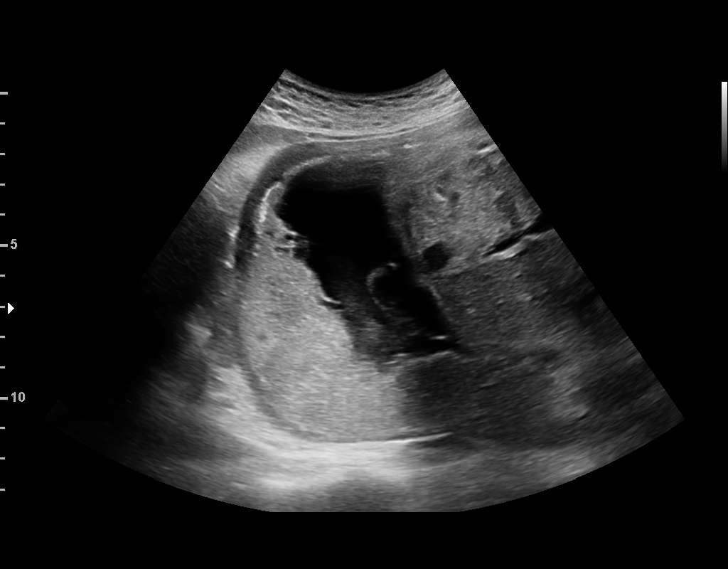
[im 16/28]
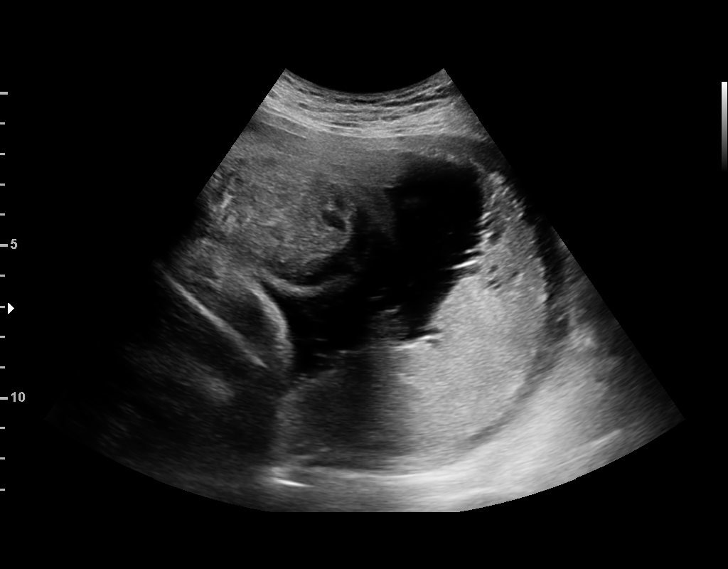
[im 18/28]
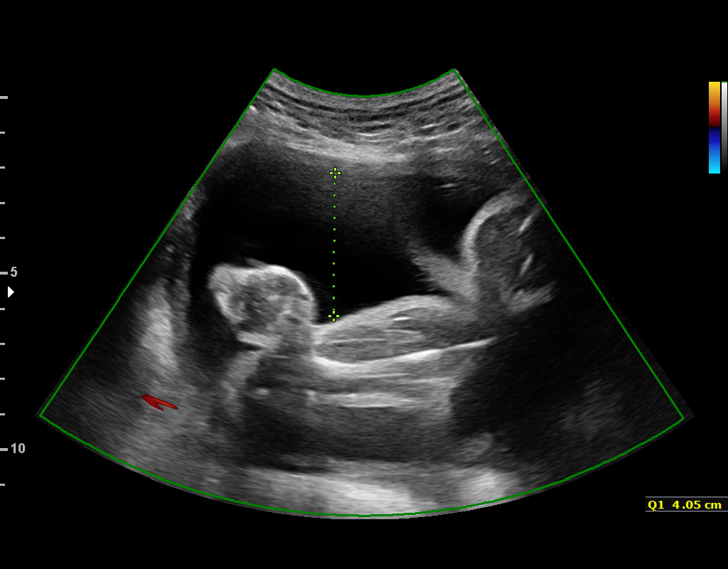
[im 20/28]
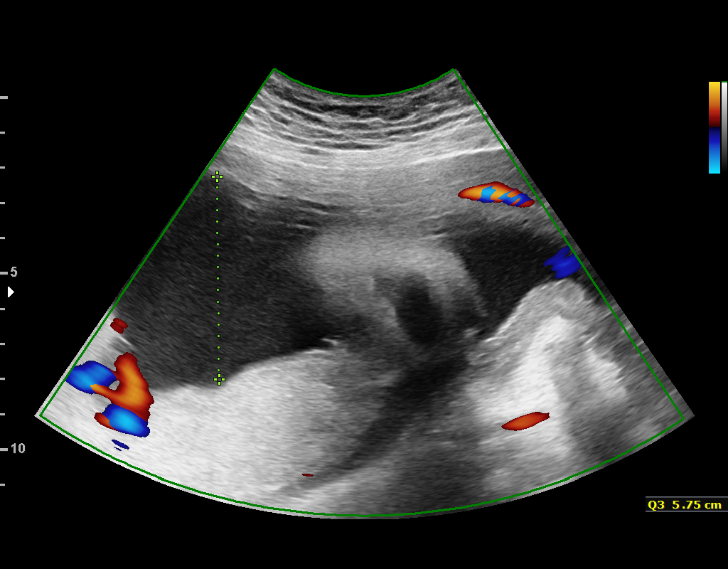
[im 22/28]
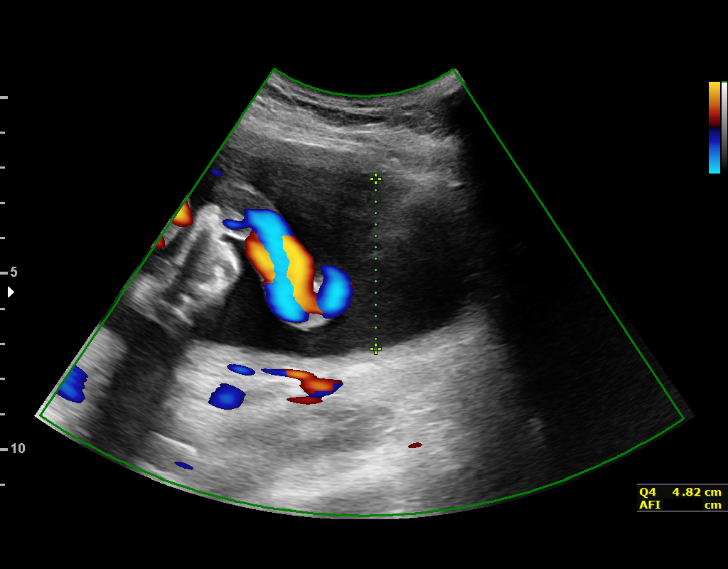
[im 24/28]
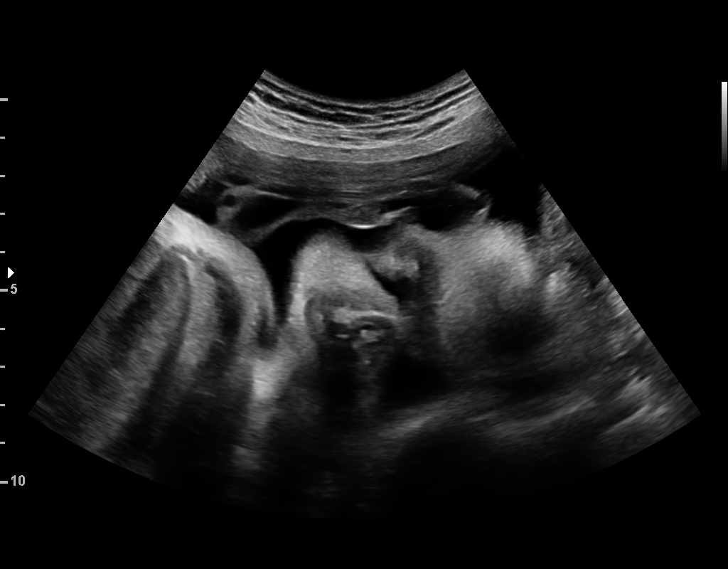
[im 26/28]
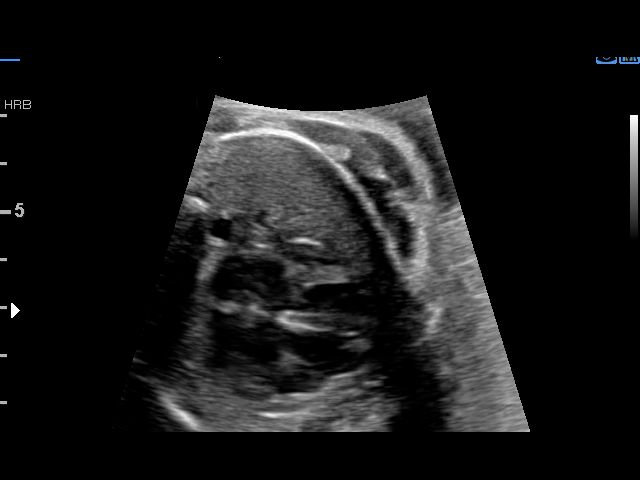
[im 28/28]
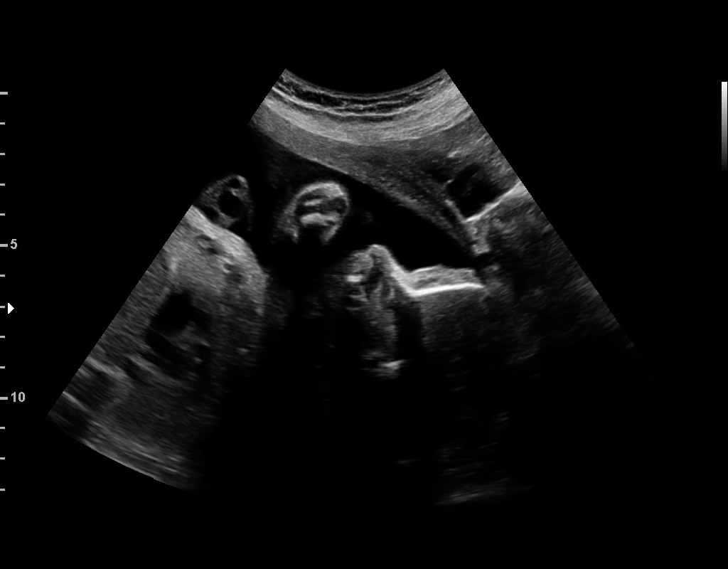

[14 of 28 positions shown; findings below may reference images not displayed]

[REDACTED] [HOSPITAL]

1  HAGEMAN           [id number]      [id number]     [id number]
Indications

32 weeks gestation of pregnancy
Cholestasis of pregnancy, third trimester      [id number]
OB History

Gravidity:    1
Fetal Evaluation

Num Of Fetuses:     1
Fetal Heart         130
Rate(bpm):
Cardiac Activity:   Observed
Presentation:       Cephalic
Placenta:           Anterior, above cervical os
P. Cord Insertion:  Previously Visualized

Amniotic Fluid
AFI FV:      Subjectively within normal limits

AFI Sum(cm)     %Tile       Largest Pocket(cm)
17.84           66

RUQ(cm)       RLQ(cm)       LUQ(cm)        LLQ(cm)
4.05
Biophysical Evaluation

Amniotic F.V:   Within normal limits       F. Tone:         Observed
F. Movement:    Observed                   Score:           [DATE]
F. Breathing:   Observed
Gestational Age

LMP:           32w 0d        Date:  05/03/17                 EDD:   02/07/18
Best:          32w 0d     Det. By:  LMP  (05/03/17)          EDD:   02/07/18
Anatomy

Cranium:               Appears normal         Stomach:                Appears normal, left
sided
Ventricles:            Appears normal         Abdomen:                Appears normal
Thoracic:              Appears normal         Kidneys:                Appear normal
Heart:                 Appears normal         Bladder:                Appears normal
(4CH, axis, and situs
Diaphragm:             Appears normal
Cervix Uterus Adnexa

Cervix
Not visualized (advanced GA >30wks)
Impression

SIUP at 32+0 weeks
Normal amniotic fluid volume
BPP [DATE]
Recommendations

Consider re-evaluating diagnosis of cholestasis in this patient
(only has pruritis at site of rash on hand)
BPP scheduled for next week if needed

## 2021-08-25 LAB — OB RESULTS CONSOLE GC/CHLAMYDIA
Chlamydia: NEGATIVE
Neisseria Gonorrhea: NEGATIVE

## 2021-08-25 LAB — OB RESULTS CONSOLE HIV ANTIBODY (ROUTINE TESTING): HIV: NONREACTIVE

## 2021-08-25 LAB — OB RESULTS CONSOLE HEPATITIS B SURFACE ANTIGEN: Hepatitis B Surface Ag: NEGATIVE

## 2021-08-25 LAB — OB RESULTS CONSOLE RUBELLA ANTIBODY, IGM: Rubella: IMMUNE

## 2021-08-25 LAB — HEPATITIS C ANTIBODY: HCV Ab: NEGATIVE

## 2021-09-07 NOTE — L&D Delivery Note (Signed)
OB/GYN Faculty Practice Delivery Note  Michelle Bridges is a 31 y.o. G3P3003 s/p SVD at [redacted]w[redacted]d. She was admitted for SOL.   ROM: 0h 4m with clear fluid GBS Status: Negative   Labor Progress: Patient presented in active labor at 6 cm dilation. AROM performed shortly after arrival to L&D with clear fluid. She subsequently progressed to complete.   Delivery Date/Time: 03/18/22 at 1255   Delivery: Called to room and patient was complete and pushing. Head delivered LOA. No nuchal cord present. Shoulders and body delivered in usual fashion. Infant with spontaneous cry, placed on mother's abdomen, dried and stimulated. Cord clamped x 2 after 1-minute delay and cut by FOB under direct supervision. Cord blood drawn. Gentle cord traction performed to assist with delivery of placenta. Fundus firm with massage and Pitocin. TXA given. Labia, perineum, vagina, and cervix were inspected, and a 1st degree perineal laceration was noted that was repaired with 3-0 Vicryl and found to be hemostatic.  After 30 minutes, placenta still not yet delivered despite intermittent fundal massage and gentle cord traction.   Dr. Debroah Loop called to bedside for evaluation. Per Dr. Debroah Loop, sublingual nitroglycerin administered x 3 to assist with uterine relaxation. Placenta still firmly attached.   IV Fentanyl given and manual extraction recommended. Dr. Debroah Loop performed manual extraction. Placenta overall appeared intact - sent to pathology.   Lower uterine segment noted to be slightly boggy. Rectal Cytotec 1000 mcg placed.    2 g Ancef ordered for prophylaxis due to manual extraction. Will continue to monitor closely on L&D for the next hour prior to transfer to postpartum.   Placenta: Intact, 3VC - sent to pathology  Complications: Retained placenta - manual extraction performed Lacerations: 1st degree perineal  EBL: 375 cc Analgesia: Local 1% lidocaine   Infant: Viable female  APGARs 8 and 9    Evalina Field, MD OB/GYN Fellow, Faculty Practice

## 2021-09-23 ENCOUNTER — Other Ambulatory Visit: Payer: Self-pay | Admitting: Family Medicine

## 2021-09-23 DIAGNOSIS — Z3689 Encounter for other specified antenatal screening: Secondary | ICD-10-CM

## 2021-10-23 ENCOUNTER — Encounter: Payer: Self-pay | Admitting: *Deleted

## 2021-10-29 ENCOUNTER — Encounter: Payer: Self-pay | Admitting: *Deleted

## 2021-10-29 ENCOUNTER — Ambulatory Visit: Payer: Medicaid Other | Admitting: *Deleted

## 2021-10-29 ENCOUNTER — Ambulatory Visit: Payer: Medicaid Other | Attending: Family Medicine

## 2021-10-29 ENCOUNTER — Other Ambulatory Visit: Payer: Self-pay

## 2021-10-29 ENCOUNTER — Ambulatory Visit (HOSPITAL_BASED_OUTPATIENT_CLINIC_OR_DEPARTMENT_OTHER): Payer: Medicaid Other

## 2021-10-29 VITALS — BP 98/56 | HR 80 | Ht 60.0 in

## 2021-10-29 DIAGNOSIS — Z3689 Encounter for other specified antenatal screening: Secondary | ICD-10-CM

## 2021-10-29 DIAGNOSIS — O359XX0 Maternal care for (suspected) fetal abnormality and damage, unspecified, not applicable or unspecified: Secondary | ICD-10-CM | POA: Insufficient documentation

## 2021-10-29 DIAGNOSIS — Z148 Genetic carrier of other disease: Secondary | ICD-10-CM | POA: Diagnosis not present

## 2021-10-29 DIAGNOSIS — Z363 Encounter for antenatal screening for malformations: Secondary | ICD-10-CM | POA: Insufficient documentation

## 2021-10-29 DIAGNOSIS — Z3A19 19 weeks gestation of pregnancy: Secondary | ICD-10-CM | POA: Insufficient documentation

## 2021-10-29 NOTE — Progress Notes (Signed)
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 Michelle Bridges politely declined to have genetic counseling. She reports her reproductive partner's SMA carrier screen was negative and she does not have any questions at this time.    Thank you for sharing in the care of Michelle Bridges with [place].  Please do not hesitate to contact us if you have any questions.  Staci Righter, MS, [facility]

## 2022-01-27 ENCOUNTER — Ambulatory Visit: Payer: Medicaid Other | Attending: Family Medicine | Admitting: Physical Therapy

## 2022-01-27 DIAGNOSIS — M62838 Other muscle spasm: Secondary | ICD-10-CM | POA: Insufficient documentation

## 2022-01-27 DIAGNOSIS — R293 Abnormal posture: Secondary | ICD-10-CM | POA: Diagnosis not present

## 2022-01-27 DIAGNOSIS — Z3A32 32 weeks gestation of pregnancy: Secondary | ICD-10-CM | POA: Insufficient documentation

## 2022-01-27 DIAGNOSIS — R102 Pelvic and perineal pain: Secondary | ICD-10-CM | POA: Insufficient documentation

## 2022-01-27 DIAGNOSIS — M6281 Muscle weakness (generalized): Secondary | ICD-10-CM | POA: Diagnosis not present

## 2022-01-27 DIAGNOSIS — O26893 Other specified pregnancy related conditions, third trimester: Secondary | ICD-10-CM | POA: Diagnosis present

## 2022-01-27 NOTE — Therapy (Unsigned)
OUTPATIENT PHYSICAL THERAPY FEMALE PELVIC EVALUATION   Patient Name: Michelle Bridges MRN: [id number] DOB:Apr 19, 1991, 31 y.o., female Today's Date: 01/28/2022   PT End of Session - 01/27/22 1439     Visit Number 1    Number of Visits 27    Date for PT Re-Evaluation 04/21/22    Authorization Type UHC no auth needed - 27 VL    PT Start Time 1445    PT Stop Time 1515    PT Time Calculation (min) 30 min    Activity Tolerance Patient tolerated treatment well    Behavior During Therapy WFL for tasks assessed/performed             Past Medical History:  Diagnosis Date   Eczema    Past Surgical History:  Procedure Laterality Date   LASIK     NO PAST SURGERIES     Patient Active Problem List   Diagnosis Date Noted   Vitamin D deficiency 08/26/2017    PCP: None per patient  REFERRING PROVIDER: Serita ButcherKritzer, Morenike, FNP  REFERRING DIAG: R10.2 (ICD-10-CM) - Pelvic pain  THERAPY DIAG:  Muscle weakness (generalized)  Abnormal posture  Other muscle spasm  Rationale for Evaluation and Treatment Rehabilitation  ONSET DATE: one month  SUBJECTIVE:                                                                                                                                                                                           SUBJECTIVE STATEMENT: Painful when walking and started getting worse last month.  Pt is [redacted] weeks pregnant currently.  Gets up to 8/10 and gets down to a 3/10 Fluid intake:Yes  Patient confirms identification and approves PT to assess pelvic floor and treatment Yes   PAIN:  Are you having pain? Yes NPRS scale: 5/10 Pain location:  pubic symphasis  Pain type: sharp Pain description: intermittent   Aggravating factors: turning in bed, walking Relieving factors: sitting a little  PRECAUTIONS: None  WEIGHT BEARING RESTRICTIONS No  FALLS:  Has patient fallen in last 6 months? No  LIVING ENVIRONMENT: Lives with: lives with their  family, lives with their spouse, and 2 children Lives in: House/apartment   OCCUPATION: not currently working  PLOF: Independent  PATIENT GOALS reduce or get rid of pain  PERTINENT HISTORY:  none Sexual abuse: No  BOWEL MOVEMENT Pain with bowel movement: Yes Type of bowel movement:Type (Bristol Stool Scale) 3, Frequency 1/day, and Strain Yes (a little) Fully empty rectum: Yes:   Constipated but nothing new since I was little URINATION Normal   INTERCOURSE Pain with intercourse:  No just not having time/energy Ability  to have vaginal penetration:  Yes:     PREGNANCY Vaginal deliveries 2, previous was 10/25/19 Tearing Yes: 3rd deg   PROLAPSE None    OBJECTIVE:    PATIENT SURVEYS:    PFIQ-7 = 43 for POPIQ  COGNITION:  Overall cognitive status: Within functional limits for tasks assessed      MUSCLE LENGTH: Hamstrings: Right 70 deg; Left 70 deg Tight bilat  LUMBAR SPECIAL TESTS:  PSLR neg  FUNCTIONAL TESTS:  Rt SLS more pain and trendelenburg   GAIT:  Comments: shortened step length  POSTURE:  Pregnancy posture and weight shift Lt  LUMBARAROM/PROM  A/PROM A/PROM  eval  Flexion   Extension   Right lateral flexion   Left lateral flexion   Right rotation   Left rotation    (Blank rows = not tested)  LOWER EXTREMITY ROM:  Passive ROM Right eval Left eval  Hip flexion 100 deg +pain 90 deg  Hip extension    Hip abduction    Hip adduction    Hip internal rotation [facility] WFL  Hip external rotation [facility]  Knee flexion    Knee extension    Ankle dorsiflexion    Ankle plantarflexion    Ankle inversion    Ankle eversion     (Blank rows = not tested)  LOWER EXTREMITY MMT:  MMT Right eval Left eval  Hip flexion    Hip extension    Hip abduction 4+/5 4+/5  Hip adduction 4/5 4/5  Hip internal rotation    Hip external rotation    Knee flexion    Knee extension    Ankle dorsiflexion    Ankle plantarflexion    Ankle inversion     Ankle eversion     PELVIC MMT:   MMT eval  Vaginal 2/5  Internal Anal Sphincter   External Anal Sphincter   Puborectalis   Diastasis Recti   (Blank rows = not tested)        PALPATION:   General  restricted upper abdomen, low back tight, gluteals tight, TTP rectus abdominal attachments                External Perineal Exam pubic symphysis TTP and swelling                             Internal Pelvic Floor 2 sec hold; 2/5 MMT, TTP Lt>Rt  TONE: normal  PROLAPSE: no  TODAY'S TREATMENT        HOME EXERCISE PROGRAM: None given today  ASSESSMENT:  CLINICAL IMPRESSION: Patient is a 31 y.o. female who was seen today for physical therapy evaluation and treatment for pelvic pain during pregnancy. Pt is in 3rd trimester.  She has swelling of mons pubis and TTP pubic symphasis.  Pt has pregnancy posture. Pain with hip flexion and weakness in hips as noted above.  Pt has weakness of abdominal and muscle attachments at the pubic bones.  Pt will benefit from skilled PT to address these impairments and restore maximum function for healthy pregnancy   OBJECTIVE IMPAIRMENTS decreased activity tolerance, decreased endurance, difficulty walking, decreased ROM, decreased strength, impaired flexibility, impaired tone, postural dysfunction, and pain.   ACTIVITY LIMITATIONS carrying, lifting, bending, standing, squatting, sleeping, transfers, and caring for others  PARTICIPATION LIMITATIONS: community activity and occupation  PERSONAL FACTORS 1-2 comorbidities: 2 previous vaginal deliveries with tearing  are also affecting patient's functional outcome.   REHAB POTENTIAL: Excellent  CLINICAL DECISION MAKING:  Evolving/moderate complexity  EVALUATION COMPLEXITY: Moderate   GOALS: Goals reviewed with patient? Yes  SHORT TERM GOALS:   LTG = STG    LONG TERM GOALS: Target date: 04/21/2022   Pt will be independent with advanced HEP to maintain improvements made throughout  therapy  Baseline:  Goal status: INITIAL  2.  Pt will report 80% reduction of pain due to improvements in posture, strength, and muscle length  Baseline:  Goal status: INITIAL  3.  Pt will demonstrate improved ability to walk for at least 30 minutes without increased pain Baseline:  Goal status: INITIAL    PLAN: PT FREQUENCY: 1x/week  PT DURATION: 12 weeks  PLANNED INTERVENTIONS: Therapeutic exercises, Therapeutic activity, Neuromuscular re-education, Balance training, Gait training, Patient/Family education, Joint mobilization, Electrical stimulation, Cryotherapy, Moist heat, Taping, Biofeedback, Manual therapy, and Re-evaluation  PLAN FOR NEXT SESSION: baby belly band handout and try it; k-tape supporting round ligaments; lumbar and gluteal, adductor massage, abdominal fascial; core strength   Brayton Caves Britteny Fiebelkorn, PT 01/28/2022, 2:29 PM

## 2022-01-28 ENCOUNTER — Encounter: Payer: Self-pay | Admitting: Physical Therapy

## 2022-02-18 ENCOUNTER — Encounter: Payer: Medicaid Other | Admitting: Physical Therapy

## 2022-02-24 LAB — OB RESULTS CONSOLE GBS: GBS: NEGATIVE

## 2022-02-24 NOTE — Therapy (Addendum)
OUTPATIENT PHYSICAL THERAPY TREATMENT NOTE   Patient Name: Michelle Bridges MRN: [id number] DOB:1991/05/30, 31 y.o., female Today's Date: 02/25/2022    PCP: None per patient   REFERRING PROVIDER: [name] Person, FNP  END OF SESSION:   PT End of Session - 02/25/22 1531     Visit Number 2    Number of Visits 27    Date for PT Re-Evaluation 04/21/22    Authorization Type UHC no auth needed - 27 VL    PT Start Time 7741    PT Stop Time 1527    PT Time Calculation (min) 40 min    Activity Tolerance Patient tolerated treatment well    Behavior During Therapy WFL for tasks assessed/performed             Past Medical History:  Diagnosis Date   Eczema    Past Surgical History:  Procedure Laterality Date   LASIK     NO PAST SURGERIES     Patient Active Problem List   Diagnosis Date Noted   Vitamin D deficiency 08/26/2017    REFERRING DIAG: R10.2 (ICD-10-CM) - Pelvic pain  THERAPY DIAG:  Muscle weakness (generalized)  Abnormal posture  Other muscle spasm  Rationale for Evaluation and Treatment Rehabilitation  PERTINENT HISTORY: 3rd deg vaginal tear during delivery  PRECAUTIONS: 3rd trimester  SUBJECTIVE: I feel just a little better with the belly band.  It hurts at night when I turn, keeping my knees together just helps a little  PAIN:  Are you having pain? Yes - no number given it happens with movement and rolling in bed   OBJECTIVE: (objective measures completed at initial evaluation unless otherwise dated)  PATIENT SURVEYS:      PFIQ-7 = 43 for POPIQ   COGNITION:            Overall cognitive status: Within functional limits for tasks assessed                            MUSCLE LENGTH: Hamstrings: Right 70 deg; Left 70 deg Tight bilat   LUMBAR SPECIAL TESTS:  PSLR neg   FUNCTIONAL TESTS:  Rt SLS more pain and trendelenburg     GAIT:   Comments: shortened step length   POSTURE:  Pregnancy posture and weight shift Lt    LUMBARAROM/PROM   A/PROM A/PROM  eval  Flexion    Extension    Right lateral flexion    Left lateral flexion    Right rotation    Left rotation     (Blank rows = not tested)   LOWER EXTREMITY ROM:   Passive ROM Right eval Left eval  Hip flexion 100 deg +pain 90 deg  Hip extension      Hip abduction      Hip adduction      Hip internal rotation [facility]  Hip external rotation [facility]  Knee flexion      Knee extension      Ankle dorsiflexion      Ankle plantarflexion      Ankle inversion      Ankle eversion       (Blank rows = not tested)   LOWER EXTREMITY MMT:   MMT Right eval Left eval  Hip flexion      Hip extension      Hip abduction 4+/5 4+/5  Hip adduction 4/5 4/5  Hip internal rotation      Hip external rotation  Knee flexion      Knee extension      Ankle dorsiflexion      Ankle plantarflexion      Ankle inversion      Ankle eversion        PELVIC MMT:   MMT eval  Vaginal 2/5  Internal Anal Sphincter    External Anal Sphincter    Puborectalis    Diastasis Recti    (Blank rows = not tested)         PALPATION:   General  restricted upper abdomen, low back tight, gluteals tight, TTP rectus abdominal attachments                 External Perineal Exam pubic symphysis TTP and swelling                             Internal Pelvic Floor 2 sec hold; 2/5 MMT, TTP Lt>Rt   TONE: normal   PROLAPSE: no   TODAY'S TREATMENT    Treatment:02/25/22 Exercises  Quardruped UE reaches Lean on table transversus abdominus activation Cat cow Manual K-tape  from pubis to ribcage crossing in the center to support round ligaments Abdominal fascial release in supine Low back and gluteals bil in sidelying Nuero Re-ed Education and cues for coordination of breathing and pelvic floor muscle contracting and relaxing at appropriate times           HOME EXERCISE PROGRAM: None given today   ASSESSMENT:   CLINICAL IMPRESSION: Today's session focused  on pain management and basic core strength.  Pt tolerated STM well and k-tape applied to support to round ligaments.  Pt needed cues for exercises to keep from collapsing ribcage when lifting and reacing one arm. Pt had slight improvement after treatment today.  Pt will benefit from skilled PT for pain management.     OBJECTIVE IMPAIRMENTS decreased activity tolerance, decreased endurance, difficulty walking, decreased ROM, decreased strength, impaired flexibility, impaired tone, postural dysfunction, and pain.    ACTIVITY LIMITATIONS carrying, lifting, bending, standing, squatting, sleeping, transfers, and caring for others   PARTICIPATION LIMITATIONS: community activity and occupation   PERSONAL FACTORS 1-2 comorbidities: 2 previous vaginal deliveries with tearing  are also affecting patient's functional outcome.    REHAB POTENTIAL: Excellent   CLINICAL DECISION MAKING: Evolving/moderate complexity   EVALUATION COMPLEXITY: Moderate     GOALS: Goals reviewed with patient? Yes   SHORT TERM GOALS:    LTG = STG       LONG TERM GOALS: Target date: 04/21/2022   updated 02/25/22  Pt will be independent with advanced HEP to maintain improvements made throughout therapy   Baseline:  Goal status: Ongoing 02/25/22    2.  Pt will report 80% reduction of pain due to improvements in posture, strength, and muscle length   Baseline:  Goal status: Ongoing   3.  Pt will demonstrate improved ability to walk for at least 30 minutes without increased pain Baseline:  Goal status: Ongoing       PLAN: PT FREQUENCY: 1x/week   PT DURATION: 12 weeks   PLANNED INTERVENTIONS: Therapeutic exercises, Therapeutic activity, Neuromuscular re-education, Balance training, Gait training, Patient/Family education, Joint mobilization, Electrical stimulation, Cryotherapy, Moist heat, Taping, Biofeedback, Manual therapy, and Re-evaluation   PLAN FOR NEXT SESSION: , abdominal fascial; core strength,  gluteal strengthening     Camillo Flaming Terin Dierolf, PT 02/25/2022, 3:31 PM    PHYSICAL THERAPY DISCHARGE SUMMARY  Visits from  Start of Care: 2  Current functional level related to goals / functional outcomes: See above goals   Remaining deficits: See above   Education / Equipment: HEP   Patient agrees to discharge. Patient goals were not met. Patient is being discharged due to not returning since the last visit.  Gustavus Bryant, PT 05/13/22 2:02 PM

## 2022-02-25 ENCOUNTER — Ambulatory Visit: Payer: Medicaid Other | Attending: Family Medicine | Admitting: Physical Therapy

## 2022-02-25 ENCOUNTER — Encounter: Payer: Self-pay | Admitting: Physical Therapy

## 2022-02-25 DIAGNOSIS — R293 Abnormal posture: Secondary | ICD-10-CM | POA: Insufficient documentation

## 2022-02-25 DIAGNOSIS — M6281 Muscle weakness (generalized): Secondary | ICD-10-CM | POA: Insufficient documentation

## 2022-02-25 DIAGNOSIS — M62838 Other muscle spasm: Secondary | ICD-10-CM | POA: Insufficient documentation

## 2022-03-04 ENCOUNTER — Telehealth: Payer: Self-pay | Admitting: Physical Therapy

## 2022-03-04 ENCOUNTER — Ambulatory Visit: Payer: Medicaid Other | Admitting: Physical Therapy

## 2022-03-04 NOTE — Telephone Encounter (Signed)
Patient did not show for appointment.  Patient was called and PT left message to please call us back.  Russella Dar, PT 03/04/22 3:17 PM

## 2022-03-04 NOTE — Therapy (Deleted)
OUTPATIENT PHYSICAL THERAPY TREATMENT NOTE   Patient Name: Michelle Bridges MRN: [id number] DOB:14-Jan-1991, 31 y.o., female Today's Date: 03/04/2022    PCP: None per patient   REFERRING PROVIDER: Serita Butcher, FNP  END OF SESSION:     Past Medical History:  Diagnosis Date   Eczema    Past Surgical History:  Procedure Laterality Date   LASIK     NO PAST SURGERIES     Patient Active Problem List   Diagnosis Date Noted   Vitamin D deficiency 08/26/2017    REFERRING DIAG: R10.2 (ICD-10-CM) - Pelvic pain  THERAPY DIAG:  No diagnosis found.  Rationale for Evaluation and Treatment Rehabilitation  PERTINENT HISTORY: 3rd deg vaginal tear during delivery  PRECAUTIONS: 3rd trimester  SUBJECTIVE: I feel just a little better with the belly band.  It hurts at night when I turn, keeping my knees together just helps a little  PAIN:  Are you having pain? Yes - no number given it happens with movement and rolling in bed   OBJECTIVE: (objective measures completed at initial evaluation unless otherwise dated)  PATIENT SURVEYS:      PFIQ-7 = 43 for POPIQ   COGNITION:            Overall cognitive status: Within functional limits for tasks assessed                            MUSCLE LENGTH: Hamstrings: Right 70 deg; Left 70 deg Tight bilat   LUMBAR SPECIAL TESTS:  PSLR neg   FUNCTIONAL TESTS:  Rt SLS more pain and trendelenburg     GAIT:   Comments: shortened step length   POSTURE:  Pregnancy posture and weight shift Lt   LUMBARAROM/PROM   A/PROM A/PROM  eval  Flexion    Extension    Right lateral flexion    Left lateral flexion    Right rotation    Left rotation     (Blank rows = not tested)   LOWER EXTREMITY ROM:   Passive ROM Right eval Left eval  Hip flexion 100 deg +pain 90 deg  Hip extension      Hip abduction      Hip adduction      Hip internal rotation [facility] WFL  Hip external rotation [facility]  Knee flexion      Knee  extension      Ankle dorsiflexion      Ankle plantarflexion      Ankle inversion      Ankle eversion       (Blank rows = not tested)   LOWER EXTREMITY MMT:   MMT Right eval Left eval  Hip flexion      Hip extension      Hip abduction 4+/5 4+/5  Hip adduction 4/5 4/5  Hip internal rotation      Hip external rotation      Knee flexion      Knee extension      Ankle dorsiflexion      Ankle plantarflexion      Ankle inversion      Ankle eversion        PELVIC MMT:   MMT eval  Vaginal 2/5  Internal Anal Sphincter    External Anal Sphincter    Puborectalis    Diastasis Recti    (Blank rows = not tested)         PALPATION:   General  restricted upper abdomen,  low back tight, gluteals tight, TTP rectus abdominal attachments                 External Perineal Exam pubic symphysis TTP and swelling                             Internal Pelvic Floor 2 sec hold; 2/5 MMT, TTP Lt>Rt   TONE: normal   PROLAPSE: no   TODAY'S TREATMENT    Treatment:02/25/22 Exercises  Quardruped UE reaches Lean on table transversus abdominus activation Cat cow Manual K-tape  from pubis to ribcage crossing in the center to support round ligaments Abdominal fascial release in supine Low back and gluteals bil in sidelying Nuero Re-ed Education and cues for coordination of breathing and pelvic floor muscle contracting and relaxing at appropriate times           HOME EXERCISE PROGRAM: None given today   ASSESSMENT:   CLINICAL IMPRESSION: Today's session focused on pain management and basic core strength.  Pt tolerated STM well and k-tape applied to support to round ligaments.  Pt needed cues for exercises to keep from collapsing ribcage when lifting and reacing one arm. Pt had slight improvement after treatment today.  Pt will benefit from skilled PT for pain management.     OBJECTIVE IMPAIRMENTS decreased activity tolerance, decreased endurance, difficulty walking, decreased ROM,  decreased strength, impaired flexibility, impaired tone, postural dysfunction, and pain.    ACTIVITY LIMITATIONS carrying, lifting, bending, standing, squatting, sleeping, transfers, and caring for others   PARTICIPATION LIMITATIONS: community activity and occupation   PERSONAL FACTORS 1-2 comorbidities: 2 previous vaginal deliveries with tearing  are also affecting patient's functional outcome.    REHAB POTENTIAL: Excellent   CLINICAL DECISION MAKING: Evolving/moderate complexity   EVALUATION COMPLEXITY: Moderate     GOALS: Goals reviewed with patient? Yes   SHORT TERM GOALS:    LTG = STG       LONG TERM GOALS: Target date: 04/21/2022   updated 02/25/22  Pt will be independent with advanced HEP to maintain improvements made throughout therapy   Baseline:  Goal status: Ongoing 02/25/22    2.  Pt will report 80% reduction of pain due to improvements in posture, strength, and muscle length   Baseline:  Goal status: Ongoing   3.  Pt will demonstrate improved ability to walk for at least 30 minutes without increased pain Baseline:  Goal status: Ongoing       PLAN: PT FREQUENCY: 1x/week   PT DURATION: 12 weeks   PLANNED INTERVENTIONS: Therapeutic exercises, Therapeutic activity, Neuromuscular re-education, Balance training, Gait training, Patient/Family education, Joint mobilization, Electrical stimulation, Cryotherapy, Moist heat, Taping, Biofeedback, Manual therapy, and Re-evaluation   PLAN FOR NEXT SESSION: , abdominal fascial; core strength, gluteal strengthening     Michelle Bridges, PT 03/04/2022, 12:03 PM

## 2022-03-11 ENCOUNTER — Encounter: Payer: Medicaid Other | Admitting: Physical Therapy

## 2022-03-18 ENCOUNTER — Inpatient Hospital Stay (HOSPITAL_COMMUNITY)
Admission: AD | Admit: 2022-03-18 | Discharge: 2022-03-19 | DRG: 807 | Disposition: A | Discharge status: Home / Self Care | Payer: Medicaid Other | Attending: Obstetrics & Gynecology | Admitting: Obstetrics & Gynecology

## 2022-03-18 ENCOUNTER — Encounter (HOSPITAL_COMMUNITY): Payer: Self-pay | Admitting: Obstetrics and Gynecology

## 2022-03-18 ENCOUNTER — Other Ambulatory Visit: Payer: Self-pay

## 2022-03-18 ENCOUNTER — Ambulatory Visit: Payer: Medicaid Other | Admitting: Physical Therapy

## 2022-03-18 DIAGNOSIS — Z148 Genetic carrier of other disease: Secondary | ICD-10-CM | POA: Diagnosis not present

## 2022-03-18 DIAGNOSIS — Z3A39 39 weeks gestation of pregnancy: Secondary | ICD-10-CM | POA: Diagnosis not present

## 2022-03-18 DIAGNOSIS — O26893 Other specified pregnancy related conditions, third trimester: Principal | ICD-10-CM | POA: Diagnosis present

## 2022-03-18 HISTORY — DX: Other specified health status: Z78.9

## 2022-03-18 LAB — CBC
HCT: 38.4 % (ref 36.0–46.0)
Hemoglobin: 12.9 g/dL (ref 12.0–15.0)
MCH: 29.8 pg (ref 26.0–34.0)
MCHC: 33.6 g/dL (ref 30.0–36.0)
MCV: 88.7 fL (ref 80.0–100.0)
Platelets: 183 10*3/uL (ref 150–400)
RBC: 4.33 MIL/uL (ref 3.87–5.11)
RDW: 16.6 % — ABNORMAL HIGH (ref 11.5–15.5)
WBC: 7.2 10*3/uL (ref 4.0–10.5)
nRBC: 0 % (ref 0.0–0.2)

## 2022-03-18 LAB — TYPE AND SCREEN
ABO/RH(D): A POS
Antibody Screen: NEGATIVE

## 2022-03-18 MED ORDER — MISOPROSTOL 200 MCG PO TABS
1000.0000 ug | ORAL_TABLET | Freq: Once | ORAL | Status: AC
  Administered 2022-03-18: 1000 ug via RECTAL

## 2022-03-18 MED ORDER — OXYCODONE HCL 5 MG PO TABS: 10.0000 mg | ORAL_TABLET | ORAL | Status: DC | PRN

## 2022-03-18 MED ORDER — NITROGLYCERIN 0.4 MG/SPRAY TL SOLN
1.0000 | Status: DC | PRN
  Administered 2022-03-18 (×2): 6 via SUBLINGUAL
  Administered 2022-03-18: 4 via SUBLINGUAL

## 2022-03-18 MED ORDER — FENTANYL CITRATE (PF) 100 MCG/2ML IJ SOLN
50.0000 ug | INTRAMUSCULAR | Status: DC | PRN
  Administered 2022-03-18: 100 ug via INTRAVENOUS
  Filled 2022-03-18 (×2): qty 2

## 2022-03-18 MED ORDER — OXYCODONE-ACETAMINOPHEN 5-325 MG PO TABS: 1.0000 | ORAL_TABLET | ORAL | Status: DC | PRN

## 2022-03-18 MED ORDER — NITROGLYCERIN 0.4 MG/SPRAY TL SOLN
Status: AC
  Filled 2022-03-18: qty 4.9

## 2022-03-18 MED ORDER — ACETAMINOPHEN 325 MG PO TABS
650.0000 mg | ORAL_TABLET | ORAL | Status: DC | PRN
  Administered 2022-03-18 – 2022-03-19 (×2): 650 mg via ORAL
  Filled 2022-03-18 (×2): qty 2

## 2022-03-18 MED ORDER — COCONUT OIL OIL: 1.0000 | TOPICAL_OIL | Status: DC | PRN

## 2022-03-18 MED ORDER — OXYCODONE HCL 5 MG PO TABS: 5.0000 mg | ORAL_TABLET | ORAL | Status: DC | PRN

## 2022-03-18 MED ORDER — MISOPROSTOL 200 MCG PO TABS
ORAL_TABLET | ORAL | Status: AC
  Filled 2022-03-18: qty 5

## 2022-03-18 MED ORDER — ONDANSETRON HCL 4 MG/2ML IJ SOLN: 4.0000 mg | INTRAMUSCULAR | Status: DC | PRN

## 2022-03-18 MED ORDER — SIMETHICONE 80 MG PO CHEW: 80.0000 mg | CHEWABLE_TABLET | ORAL | Status: DC | PRN

## 2022-03-18 MED ORDER — WITCH HAZEL-GLYCERIN EX PADS: 1.0000 | MEDICATED_PAD | CUTANEOUS | Status: DC | PRN

## 2022-03-18 MED ORDER — ONDANSETRON HCL 4 MG/2ML IJ SOLN
4.0000 mg | Freq: Four times a day (QID) | INTRAMUSCULAR | Status: DC | PRN
  Administered 2022-03-18: 4 mg via INTRAVENOUS
  Filled 2022-03-18: qty 2

## 2022-03-18 MED ORDER — LIDOCAINE HCL (PF) 1 % IJ SOLN
30.0000 mL | INTRAMUSCULAR | Status: AC | PRN
  Administered 2022-03-18: 30 mL via SUBCUTANEOUS
  Filled 2022-03-18: qty 30

## 2022-03-18 MED ORDER — FENTANYL CITRATE (PF) 100 MCG/2ML IJ SOLN
100.0000 ug | Freq: Once | INTRAMUSCULAR | Status: AC
  Administered 2022-03-18: 100 ug via INTRAVENOUS

## 2022-03-18 MED ORDER — IBUPROFEN 600 MG PO TABS
600.0000 mg | ORAL_TABLET | Freq: Four times a day (QID) | ORAL | Status: DC
  Administered 2022-03-18 – 2022-03-19 (×4): 600 mg via ORAL
  Filled 2022-03-18 (×4): qty 1

## 2022-03-18 MED ORDER — ONDANSETRON HCL 4 MG PO TABS: 4.0000 mg | ORAL_TABLET | ORAL | Status: DC | PRN

## 2022-03-18 MED ORDER — PRENATAL MULTIVITAMIN CH
1.0000 | ORAL_TABLET | Freq: Every day | ORAL | Status: DC
  Administered 2022-03-19: 1 via ORAL
  Filled 2022-03-18: qty 1

## 2022-03-18 MED ORDER — ACETAMINOPHEN 325 MG PO TABS: 650.0000 mg | ORAL_TABLET | ORAL | Status: DC | PRN

## 2022-03-18 MED ORDER — OXYCODONE-ACETAMINOPHEN 5-325 MG PO TABS: 2.0000 | ORAL_TABLET | ORAL | Status: DC | PRN

## 2022-03-18 MED ORDER — OXYTOCIN-SODIUM CHLORIDE 30-0.9 UT/500ML-% IV SOLN
2.5000 [IU]/h | INTRAVENOUS | Status: DC
  Administered 2022-03-18: 2.5 [IU]/h via INTRAVENOUS

## 2022-03-18 MED ORDER — OXYTOCIN-SODIUM CHLORIDE 30-0.9 UT/500ML-% IV SOLN
INTRAVENOUS | Status: AC
  Filled 2022-03-18: qty 500

## 2022-03-18 MED ORDER — CEFAZOLIN SODIUM-DEXTROSE 2-4 GM/100ML-% IV SOLN
2.0000 g | Freq: Once | INTRAVENOUS | Status: AC
  Administered 2022-03-18: 2 g via INTRAVENOUS
  Filled 2022-03-18: qty 100

## 2022-03-18 MED ORDER — SENNOSIDES-DOCUSATE SODIUM 8.6-50 MG PO TABS
2.0000 | ORAL_TABLET | Freq: Every day | ORAL | Status: DC
  Administered 2022-03-19: 2 via ORAL
  Filled 2022-03-18: qty 2

## 2022-03-18 MED ORDER — DIBUCAINE (PERIANAL) 1 % EX OINT
1.0000 | TOPICAL_OINTMENT | CUTANEOUS | Status: DC | PRN
Start: 2022-03-18 — End: 2022-03-19

## 2022-03-18 MED ORDER — TRANEXAMIC ACID-NACL 1000-0.7 MG/100ML-% IV SOLN
INTRAVENOUS | Status: AC
  Administered 2022-03-18: 1000 mg
  Filled 2022-03-18: qty 100

## 2022-03-18 MED ORDER — TRANEXAMIC ACID-NACL 1000-0.7 MG/100ML-% IV SOLN: 1000.0000 mg | INTRAVENOUS | Status: AC

## 2022-03-18 MED ORDER — SOD CITRATE-CITRIC ACID 500-334 MG/5ML PO SOLN: 30.0000 mL | ORAL | Status: DC | PRN

## 2022-03-18 MED ORDER — BENZOCAINE-MENTHOL 20-0.5 % EX AERO
1.0000 | INHALATION_SPRAY | CUTANEOUS | Status: DC | PRN
  Administered 2022-03-18: 1 via TOPICAL
  Filled 2022-03-18: qty 56

## 2022-03-18 MED ORDER — OXYTOCIN BOLUS FROM INFUSION
333.0000 mL | Freq: Once | INTRAVENOUS | Status: AC
  Administered 2022-03-18: 333 mL via INTRAVENOUS

## 2022-03-18 MED ORDER — LACTATED RINGERS IV SOLN: 500.0000 mL | INTRAVENOUS | Status: DC | PRN

## 2022-03-18 MED ORDER — LACTATED RINGERS IV SOLN: INTRAVENOUS | Status: DC

## 2022-03-18 MED ORDER — DIPHENHYDRAMINE HCL 25 MG PO CAPS: 25.0000 mg | ORAL_CAPSULE | Freq: Four times a day (QID) | ORAL | Status: DC | PRN

## 2022-03-18 NOTE — MAU Note (Signed)
Michelle Bridges is a 31 y.o. at [redacted]w[redacted]d here in MAU reporting: contractions started at 7 and they have been every 5 minutes or less. No bleeding. Had a gush of fluid over night but nothing since then, it was clear. +FM.  Onset of complaint: today  Pain score: 8/10  Vitals:   03/18/22 1035  BP: 114/70  Pulse: 87  Resp: 16  Temp: (!) 97.5 F (36.4 C)  SpO2: 98%     FHT:150  Lab orders placed from triage: [facility]

## 2022-03-18 NOTE — Discharge Summary (Signed)
Postpartum Discharge Summary  Date of Service updated-7/13     Patient Name: Michelle Bridges DOB: Jan 29, 1991 MRN: [id number]  Date of admission: 03/18/2022 Delivery date:03/18/2022  Delivering provider: Genia Del  Date of discharge: 03/19/2022  Admitting diagnosis: Normal labor [O80, Z37.9] Intrauterine pregnancy: [redacted]w[redacted]d    Secondary diagnosis:  Principal Problem:   Vaginal delivery Active Problems:   Normal labor   Retained placenta  Additional problems: none    Discharge diagnosis: Term Pregnancy Delivered                                              Post partum procedures:  none Augmentation: AROM Complications: Retained placenta s/p manual extraction  Hospital course: Onset of Labor With Vaginal Delivery      31y.o. yo [id number] admitted in Active Labor on 03/18/2022.  Patient had an uncomplicated labor course and progressed to complete after AROM.  She had a normal vaginal delivery complicated by retained placenta requiring manual extraction.  Membrane Rupture Time/Date: 12:30 PM ,03/18/2022   Delivery Method:Vaginal, Spontaneous  Episiotomy: None  Lacerations:  1st degree  Patient had an uncomplicated postpartum course.  Her hemoglobin on PPD#1 was 10.1, for which she received oral iron.  She is ambulating, tolerating a regular diet, passing flatus, and urinating well.  Her pain and bleeding are controlled.  Patient is discharged home in stable condition on 03/19/22.  Newborn Data: Birth date:03/18/2022  Birth time:12:55 PM  Gender:Female  Living status:Living  Apgars:8 ,9  Weight:3354 g   Magnesium Sulfate received: No BMZ received: No Rhophylac: N/A MMR: N/A T-DaP: Given prenatally Flu: No Transfusion: No  Physical exam  Vitals:   03/18/22 1625 03/18/22 2020 03/18/22 2357 03/19/22 0526  BP: 101/63 (!) 101/55 (!) 99/59 106/65  Pulse: 63 63 (!) 59 (!) 54  Resp: '16 16 18 16  ' Temp: 97.9 F (36.6 C) 98.1 F (36.7 C) 97.9 F  (36.6 C) 98 F (36.7 C)  TempSrc: Oral Oral Oral Oral  SpO2: 99% 99% 98% 100%  Weight:      Height:       General: alert, cooperative, and no distress Lochia: appropriate Uterine Fundus: firm Incision: N/A DVT Evaluation: No evidence of DVT seen on physical exam.  Labs: Lab Results  Component Value Date   WBC 9.9 03/19/2022   HGB 10.1 (L) 03/19/2022   HCT 30.1 (L) 03/19/2022   MCV 90.7 03/19/2022   PLT 117 (L) 03/19/2022      Latest Ref Rng & Units 11/15/2017   10:26 AM  CMP  Glucose 65 - 99 mg/dL 73   BUN 6 - 20 mg/dL 8   Creatinine 0.57 - 1.00 mg/dL 0.53   Sodium 134 - 144 mmol/L 141   Potassium 3.5 - 5.2 mmol/L 4.2   Chloride 96 - 106 mmol/L 102   CO2 20 - 29 mmol/L 19   Calcium 8.7 - 10.2 mg/dL 9.3   Total Protein 6.0 - 8.5 g/dL 6.5   Total Bilirubin 0.0 - 1.2 mg/dL <0.2   Alkaline Phos 39 - 117 IU/L 114   AST 0 - 40 IU/L 26   ALT 0 - 32 IU/L 20    Edinburgh Score:    03/18/2022    3:17 PM  Edinburgh Postnatal Depression Scale Screening Tool  I have been able to laugh  and see the funny side of things. 0  I have looked forward with enjoyment to things. 0  I have blamed myself unnecessarily when things went wrong. 0  I have been anxious or worried for no good reason. 0  I have felt scared or panicky for no good reason. 0  Things have been getting on top of me. 1  I have been so unhappy that I have had difficulty sleeping. 0  I have felt sad or miserable. 0  I have been so unhappy that I have been crying. 0  The thought of harming myself has occurred to me. 0  Edinburgh Postnatal Depression Scale Total 1    After visit meds:  Allergies as of 03/19/2022   No Known Allergies      Medication List     STOP taking these medications    norethindrone 0.35 MG tablet Commonly known as: MICRONOR       TAKE these medications    acetaminophen 325 MG tablet Commonly known as: Tylenol Take 2 tablets (650 mg total) by mouth every 4 (four) hours as needed  (for pain scale < 4).   ferrous gluconate 324 MG tablet Commonly known as: FERGON Take 1 tablet (324 mg total) by mouth every other day.   ibuprofen 600 MG tablet Commonly known as: ADVIL Take 1 tablet (600 mg total) by mouth every 6 (six) hours as needed. What changed:  when to take this reasons to take this   prenatal multivitamin Tabs tablet Take 1 tablet by mouth at bedtime.        Discharge home in stable condition Infant Feeding: Breast Infant Disposition: home with mother Discharge instruction: per After Visit Summary and Postpartum booklet. Activity: Advance as tolerated. Pelvic rest for 6 weeks.  Diet: routine diet Future Appointments:No future appointments. Follow up Visit:  Follow-up Information     Department, [facility]. Schedule an appointment as soon as possible for a visit in 5 week(s).   Contact information: [facility] [place] [facility] [address] [phone]                Patient will follow up at [facility] for postpartum visit in 6 weeks.   Additional Postpartum F/U: None   Low risk pregnancy complicated by:  None Delivery mode:  Vaginal, Spontaneous  Anticipated Birth Control:  IUD outpatient   03/19/2022 Annalee Genta, DO

## 2022-03-18 NOTE — H&P (Signed)
OBSTETRIC ADMISSION HISTORY AND PHYSICAL  Michelle Bridges is a 31 y.o. female G3P2002 with IUP at [redacted]w[redacted]d by LMP presenting for SOL. Her contractions started at 7 AM this morning. She reports +FMs, no VB, no blurry vision, headaches, peripheral edema, or RUQ pain.  She plans on breast feeding. She is planning for outpatient IUD placement for birth control postpartum.  She received her prenatal care at [facility].  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)  Past Medical History: Past Medical History:  Diagnosis Date   Eczema    Medical history non-contributory     Past Surgical History: Past Surgical History:  Procedure Laterality Date   LASIK     NO PAST SURGERIES      Obstetrical History: OB History     Gravida  3   Para  2   Term  2   Preterm      AB      Living  2      SAB      IAB      Ectopic      Multiple  0   Live Births  2           Social History Social History   Socioeconomic History   Marital status: Significant Other    Spouse name: Not on file   Number of children: Not on file   Years of education: Not on file   Highest education level: Not on file  Occupational History   Not on file  Tobacco Use   Smoking status: Never   Smokeless tobacco: Never  Vaping Use   Vaping Use: Never used  Substance and Sexual Activity   Alcohol use: No   Drug use: No   Sexual activity: Yes    Birth control/protection: None  Other Topics Concern   Not on file  Social History Narrative   Not on file   Social Determinants of Health   Financial Resource Strain: Not on file  Food Insecurity: Not on file  Transportation Needs: Not on file  Physical Activity: Not on file  Stress: Not on file  Social Connections: Not on file    Family History: Family History   Problem Relation Age of Onset   Asthma Maternal Aunt    Diabetes Maternal Aunt    Diabetes Paternal Grandmother    Breast cancer Mother     Allergies: No Known Allergies  Medications Prior to Admission  Medication Sig Dispense Refill Last Dose   Prenatal Vit-Fe Fumarate-FA (PRENATAL MULTIVITAMIN) TABS tablet Take 1 tablet by mouth at bedtime.    03/18/2022   ibuprofen (ADVIL) 600 MG tablet Take 1 tablet (600 mg total) by mouth every 6 (six) hours. (Patient not taking: Reported on 11/27/2019) 30 tablet 0    norethindrone (MICRONOR) 0.35 MG tablet Start on the Sunday after baby turns 12 weeks old (Patient not taking: Reported on 11/27/2019) 1 Package 11      Review of Systems  All systems reviewed and negative except as stated in HPI  Blood pressure 113/71, pulse 79, temperature 97.7 F (36.5 C), temperature source Oral, resp. rate 18, height 5' (1.524 m), weight 56.6 kg, last menstrual period 06/15/2021, SpO2 98 %, currently breastfeeding.  General appearance: alert, cooperative, and no distress Lungs: normal work of breathing on room air  Heart: normal  rate, warm and well perfused  Abdomen: soft, non-tender, gravid  Extremities: no LE edema or calf tenderness to palpation   Presentation: Cephalic per RN Fetal monitoring: Baseline 135 bpm, moderate variability, + accels, no decels  Uterine activity: Every 2 minutes  Dilation: 7 Effacement (%): 90 Station: -1 Exam by:: Sandy Salaam, RN  Prenatal labs: ABO, Rh:  A POS  Antibody:  Negative Rubella:  Immune RPR:  NR  HBsAg:  Negative  HIV:  NR  GBS: Negative/-- (06/20 0000)  1 hr Glucola normal  Genetic screening - LR NIPS, carrier for SMA (FOB negative)  Anatomy US normal   Prenatal Transfer Tool  Maternal Diabetes: No Genetic Screening: As above Maternal Ultrasounds/Referrals: Normal Fetal Ultrasounds or other Referrals:  None Maternal Substance Abuse:  No Significant Maternal Medications:  None Significant  Maternal Lab Results: Group B Strep negative  No results found for this or any previous visit (from the past 24 hour(s)).  Patient Active Problem List   Diagnosis Date Noted   Normal labor 03/18/2022   Vitamin D deficiency 08/26/2017    Assessment/Plan:  Michelle Bridges is a 31 y.o. G3P2002 at [redacted]w[redacted]d here for SOL.   #Labor: Expectant management.  #Pain: PRN; coping well  #FWB: Cat 1 #ID:  GBS negative  #MOF: Breast  #MOC: IUD outpatient   Worthy Rancher, MD  03/18/2022, 12:15 PM

## 2022-03-19 LAB — CBC
HCT: 30.1 % — ABNORMAL LOW (ref 36.0–46.0)
Hemoglobin: 10.1 g/dL — ABNORMAL LOW (ref 12.0–15.0)
MCH: 30.4 pg (ref 26.0–34.0)
MCHC: 33.6 g/dL (ref 30.0–36.0)
MCV: 90.7 fL (ref 80.0–100.0)
Platelets: 117 10*3/uL — ABNORMAL LOW (ref 150–400)
RBC: 3.32 MIL/uL — ABNORMAL LOW (ref 3.87–5.11)
RDW: 16.4 % — ABNORMAL HIGH (ref 11.5–15.5)
WBC: 9.9 10*3/uL (ref 4.0–10.5)
nRBC: 0 % (ref 0.0–0.2)

## 2022-03-19 LAB — RPR: RPR Ser Ql: NONREACTIVE

## 2022-03-19 MED ORDER — FERROUS GLUCONATE 324 (38 FE) MG PO TABS: 324.0000 mg | ORAL_TABLET | ORAL | 0 refills | Status: AC

## 2022-03-19 MED ORDER — ACETAMINOPHEN 325 MG PO TABS: 650.0000 mg | ORAL_TABLET | ORAL | Status: AC | PRN

## 2022-03-19 MED ORDER — IBUPROFEN 600 MG PO TABS: 600.0000 mg | ORAL_TABLET | Freq: Four times a day (QID) | ORAL | 0 refills | Status: AC | PRN

## 2022-03-19 NOTE — Lactation Note (Signed)
This note was copied from a baby's chart. Lactation Consultation Note Experienced BF mom declines Lactation services.  Patient Name: Michelle Bridges [id number] Date: 03/19/2022   Age:29 hours  Maternal Data    Feeding    LATCH Score                    Lactation Tools Discussed/Used    Interventions    Discharge    Consult Status Consult Status: Complete    Adanna Zuckerman, Diamond Nickel 03/19/2022, 3:27 AM

## 2022-03-19 NOTE — Progress Notes (Signed)
  POSTPARTUM PROGRESS NOTE  Subjective: Michelle Bridges is a 31 year old G3P003 PPD#1 from SVD at 1255 after presenting with SOL, delivery complicated by retained placenta requiring manual extraction. She reports doing well, no acute events overnight.  no complaints, up ad lib, voiding, tolerating PO, and + flatus.   Objective: Blood pressure 106/65, pulse (!) 54, temperature 98 F (36.7 C), temperature source Oral, resp. rate 16, height 5' (1.524 m), weight 56.6 kg, last menstrual period 06/15/2021, SpO2 100 %, unknown if currently breastfeeding.  Physical Exam:  General: alert Lochia: appropriate Uterine Fundus: firm DVT Evaluation: No evidence of DVT seen on physical exam.  Recent Labs    03/18/22 1144 03/19/22 0517  HGB 12.9 10.1*  HCT 38.4 30.1*     Assessment/Plan:  Michelle Bridges is a 31 year old G3P003 PPD#1 from SVD Meeting postpartum milestones appropriately Appropriate decline in hemoglobin, patient asymptomatic, plan for po iron Contraception: plan for IUD outpatient Feeding: plan for breastfeeding, still needs a lactation consult Patient desires discharge home today.    LOS: 1 day   Michelle Bridges, Medical Student 03/19/2022, 7:43 AM

## 2022-03-20 LAB — SURGICAL PATHOLOGY

## 2022-03-26 ENCOUNTER — Telehealth (HOSPITAL_COMMUNITY): Payer: Self-pay | Admitting: *Deleted

## 2022-03-26 NOTE — Telephone Encounter (Signed)
Mom reports feeling good. No concerns about herself at this time. EPDS=2 [facility] score=1) Mom reports baby is doing well. Feeding, peeing, and pooping without difficulty. Safe sleep reviewed. Mom reports no concerns about baby at present.  Duffy Rhody, RN 03-26-2022 at 12:43pm

## 2022-05-06 ENCOUNTER — Ambulatory Visit (HOSPITAL_COMMUNITY)
Admission: EM | Admit: 2022-05-06 | Discharge: 2022-05-06 | Disposition: A | Discharge status: Home / Self Care | Payer: Medicaid Other | Attending: Family Medicine | Admitting: Family Medicine

## 2022-05-06 ENCOUNTER — Encounter (HOSPITAL_COMMUNITY): Payer: Self-pay | Admitting: Emergency Medicine

## 2022-05-06 DIAGNOSIS — L0291 Cutaneous abscess, unspecified: Secondary | ICD-10-CM

## 2022-05-06 DIAGNOSIS — L02414 Cutaneous abscess of left upper limb: Secondary | ICD-10-CM | POA: Diagnosis not present

## 2022-05-06 MED ORDER — DOXYCYCLINE HYCLATE 100 MG PO CAPS: 100.0000 mg | ORAL_CAPSULE | Freq: Two times a day (BID) | ORAL | 0 refills | Status: AC

## 2022-05-06 MED ORDER — LIDOCAINE HCL (PF) 1 % IJ SOLN
INTRAMUSCULAR | Status: AC
  Filled 2022-05-06: qty 30

## 2022-05-06 NOTE — ED Provider Notes (Signed)
[facility]   [id number] 05/06/22 Arrival Time: 1910  ASSESSMENT & PLAN:  1. Abscess     Incision and Drainage Procedure Note  Anesthesia: 1% plain lidocaine  Procedure Details  The procedure, risks and complications have been discussed in detail (including, but not limited to pain and bleeding) with the patient.  The skin induration was prepped and draped in the usual fashion. After adequate local anesthesia, I&D with a #11 blade was performed on the left mid forearm with bloody, purulent drainage.  EBL: minimal Drains: none Packing: none Condition: Tolerated procedure well Complications: none.  Meds ordered this encounter  Medications   doxycycline (VIBRAMYCIN) 100 MG capsule    Sig: Take 1 capsule (100 mg total) by mouth 2 (two) times daily.    Dispense:  20 capsule    Refill:  0   Wound care instructions discussed and given in written format.    Follow-up Information     [facility] EMERGENCY DEPARTMENT.   Specialty: Emergency Medicine Why: If symptoms worsen in any way. Contact information: [address] [id number] mc [place] [address] [phone]               Finish all antibiotics. OTC analgesics as needed.  Reviewed expectations re: course of current medical issues. Questions answered. Outlined signs and symptoms indicating need for more acute intervention. Patient verbalized understanding. After Visit Summary given.   SUBJECTIVE:  Michelle Bridges is a 31 y.o. female who presents with a possible infection of her LEFT forearm. Sev days ago; red bump; increasing size. Went to outside clinic; attempted drainage; still swollen with increasing surrounding erythema. Afebrile. Scant yellow drainage. Not on antibiotic. "Gave me a shot in the butt and that's it."   OBJECTIVE:  Vitals:   05/06/22 1944  BP: 107/60  Pulse: 70  Resp: 18  Temp: 98.8 F (37.1 C)  TempSrc: Oral  SpO2:  100%     General appearance: alert; no distress L forearm: approx 1.5 cm induration; very tender to touch; surrounding erythema measuring approx 4x6 inches; warm to touch; no active drainage or bleeding; normal distal sensation and cap refill Psychological: alert and cooperative; normal mood and affect  No Known Allergies  Past Medical History:  Diagnosis Date   Eczema    Medical history non-contributory    Social History   Socioeconomic History   Marital status: Significant Other    Spouse name: Not on file   Number of children: Not on file   Years of education: Not on file   Highest education level: Not on file  Occupational History   Not on file  Tobacco Use   Smoking status: Never   Smokeless tobacco: Never  Vaping Use   Vaping Use: Never used  Substance and Sexual Activity   Alcohol use: No   Drug use: No   Sexual activity: Yes    Birth control/protection: None  Other Topics Concern   Not on file  Social History Narrative   Not on file   Social Determinants of Health   Financial Resource Strain: Not on file  Food Insecurity: Not on file  Transportation Needs: Not on file  Physical Activity: Not on file  Stress: Not on file  Social Connections: Not on file   Family History  Problem Relation Age of Onset   Asthma Maternal Aunt    Diabetes Maternal Aunt    Diabetes Paternal Grandmother    Breast cancer Mother    Past Surgical  History:  Procedure Laterality Date   LASIK     NO PAST SURGERIES              Mardella Layman, MD 05/07/22 [phone]

## 2022-05-06 NOTE — ED Triage Notes (Signed)
Pt reports an abscess on left forearm that started forming on last Friday. States she was seen at [facility] to have the area drained and believes it got worse. States she has pus drainage and noticed she started feeling hot with chills. States she a shot in her butt for infection.

## 2024-06-27 ENCOUNTER — Other Ambulatory Visit: Payer: Self-pay | Admitting: Family Medicine

## 2024-06-27 DIAGNOSIS — Z36 Encounter for antenatal screening for chromosomal anomalies: Secondary | ICD-10-CM

## 2024-08-07 ENCOUNTER — Encounter: Payer: Self-pay | Admitting: *Deleted

## 2024-08-07 DIAGNOSIS — Z148 Genetic carrier of other disease: Secondary | ICD-10-CM | POA: Insufficient documentation

## 2024-08-11 ENCOUNTER — Ambulatory Visit

## 2024-08-11 ENCOUNTER — Other Ambulatory Visit: Attending: Family Medicine

## 2024-08-11 VITALS — BP 103/59 | HR 68

## 2024-08-11 DIAGNOSIS — Z3A19 19 weeks gestation of pregnancy: Secondary | ICD-10-CM

## 2024-08-11 DIAGNOSIS — Z36 Encounter for antenatal screening for chromosomal anomalies: Secondary | ICD-10-CM | POA: Diagnosis not present

## 2024-08-11 NOTE — Progress Notes (Unsigned)
 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 H5E6996 at [redacted]w[redacted]d here for ultrasound and consultation. She had low risk aneuploidy screening of a female fetus. Carrier screening was elevated risk to be an SMA carrier. Maternal serum AFP n/a. She has no acute concerns.   Today we focused on the following:   History of Intrahepatic Cholestasis of Pregnancy (ICP) The patient has a history of cholestasis in her first pregnancy but did not experience recurrence in subsequent pregnancies. We reviewed the risk of recurrence, noting that although not universal, patients with prior ICP remain at increased risk and should be monitored closely throughout pregnancy. I discussed the typical symptoms of recurrence--including pruritus (especially of the palms and soles) and advised prompt reporting of any symptoms. We reviewed the clinical implications of cholestasis, including increased risks of preterm delivery, meconium-stained fluid, and stillbirth, especially when bile acids are significantly elevated. We discussed that if symptoms recur, early evaluation with bile acid levels and liver function testing will be necessary, and medical management with ursodiol  may be considered. She understands the importance of ongoing symptom surveillance and timely evaluation.  Increased Risk to Be an SMA Carrier The patient was counseled regarding her increased risk to be a carrier for spinal muscular atrophy (SMA). We reviewed the autosomal recessive pattern of inheritance, the need for both parents to be carriers for the fetus to be  affected, and the associated 25% recurrence risk in that scenario. The recommendation for paternal carrier testing was discussed in detail, including its role in further clarifying the fetal risk and guiding prenatal decision-making. The patient declined paternal testing at this time and verbalized understanding of the potential implications and residual risk. She accepts these risks and is comfortable proceeding without further testing.  Recommendations -After review of the patients LMP, menstrual cycle history and any prior US , the EDD should be Estimated Date of Delivery: 01/04/25.  -Aneuploidy screening was completed at the [facility] office. -Anatomy ultrasound was done today with the above findings (see report). -Since the anatomy is complete and there are no other indications for growth ultrasounds the patient can follow-up with her OB provider. -The patient will monitor for signs or symptoms of recurrence for cholestasis and notify her OB provider.  If she does have recurrence then she would need growth ultrasounds and antenatal testing.   60 minutes of time was spent reviewing the patient's chart including labs, imaging and documentation.  At least 50% of this time was spent with direct patient care discussing the diagnosis, management and prognosis of her care.  Review of Systems: A review of systems was performed and was negative except per HPI   Past Obstetrical History:  OB History  Gravida Para Term Preterm AB Living  4 3 3   3   SAB IAB Ectopic Multiple Live Births     0 3    # Outcome Date GA Lbr Len/2nd Weight Sex Type Anes PTL Lv  4 Current           3 Term 03/18/22 [redacted]w[redacted]d 05:50 / 00:05 7 lb 6.3 oz (3.354 kg) F Vag-Spont Local  LIV  Birth Comments: WDL  2 Term 10/25/19 [redacted]w[redacted]d 04:16 / 00:05 6 lb 12.6 oz (3.079 kg) F Vag-Spont None  LIV  1 Term 01/17/18 [redacted]w[redacted]d 07:47 / 00:17 6 lb 6.3 oz (2.9 kg) F Vag-Spont None  LIV     Past Medical History:  Past Medical History:  Diagnosis Date    Eczema    Medical history non-contributory    Normal labor 03/18/2022   Vaginal delivery 03/18/2022     Past Surgical History:    Past Surgical History:  Procedure Laterality Date   LASIK     NO PAST SURGERIES       Home Medications:   Current Outpatient Medications on File Prior to Visit  Medication Sig Dispense Refill   acetaminophen  (TYLENOL ) 325 MG tablet Take 2 tablets (650 mg total) by mouth every 4 (four) hours as needed (for pain scale < 4). (Patient not taking: Reported on 08/11/2024)     doxycycline  (VIBRAMYCIN ) 100 MG capsule Take 1 capsule (100 mg total) by mouth 2 (two) times daily. (Patient not taking: Reported on 08/11/2024) 20 capsule 0   ferrous gluconate  (FERGON) 324 MG tablet Take 1 tablet (324 mg total) by mouth every other day. (Patient not taking: Reported on 08/11/2024) 30 tablet 0   ibuprofen  (ADVIL ) 600 MG tablet Take 1 tablet (600 mg total) by mouth every 6 (six) hours as needed. (Patient not taking: Reported on 08/11/2024) 30 tablet 0   Prenatal Vit-Fe Fumarate-FA (PRENATAL MULTIVITAMIN) TABS tablet Take 1 tablet by mouth at bedtime.      No current facility-administered medications on file prior to visit.      Allergies:   No Known Allergies   Physical Exam:   There were no vitals filed for this visit. Sitting comfortably on the sonogram table Nonlabored breathing Normal rate and rhythm Abdomen is nontender  Thank you for the opportunity to be involved with this patient's care. Please let us  know if we can be of any further assistance.   Delora Smaller MFM, [place]   08/11/2024  11:01 AM

## 2024-08-11 NOTE — Progress Notes (Signed)
 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 H5E6996 at [redacted]w[redacted]d here for ultrasound and consultation. She had low risk aneuploidy screening of a female fetus. Carrier screening was elevated risk to be an SMA carrier. Maternal serum AFP n/a. She has no acute concerns.   Today we focused on the following:   History of Intrahepatic Cholestasis of Pregnancy (ICP) The patient has a history of cholestasis in her first pregnancy but did not experience recurrence in subsequent pregnancies. We reviewed the risk of recurrence, noting that although not universal, patients with prior ICP remain at increased risk and should be monitored closely throughout pregnancy. I discussed the typical symptoms of recurrence--including pruritus (especially of the palms and soles) and advised prompt reporting of any symptoms. We reviewed the clinical implications of cholestasis, including increased risks of preterm delivery, meconium-stained fluid, and stillbirth, especially when bile acids are significantly elevated. We discussed that if symptoms recur, early evaluation with bile acid levels and liver function testing will be necessary, and medical management with ursodiol  may be considered. She understands the importance of ongoing symptom surveillance and timely evaluation.  Increased Risk to Be an SMA Carrier The patient was counseled regarding her increased risk to be a carrier for spinal muscular atrophy (SMA). We reviewed the autosomal recessive pattern of inheritance, the need for both parents to be carriers for the fetus to be  affected, and the associated 25% recurrence risk in that scenario. The recommendation for paternal carrier testing was discussed in detail, including its role in further clarifying the fetal risk and guiding prenatal decision-making. The patient declined paternal testing at this time and verbalized understanding of the potential implications and residual risk. She accepts these risks and is comfortable proceeding without further testing.  Recommendations -After review of the patients LMP, menstrual cycle history and any prior US , the EDD should be Estimated Date of Delivery: 01/04/25.  -Aneuploidy screening was completed at the [facility] office. -Anatomy ultrasound was done today with the above findings (see report). -Since the anatomy is complete and there are no other indications for growth ultrasounds the patient can follow-up with her OB provider. -The patient will monitor for signs or symptoms of recurrence for cholestasis and notify her OB provider.  If she does have recurrence then she would need growth ultrasounds and antenatal testing.   60 minutes of time was spent reviewing the patient's chart including labs, imaging and documentation.  At least 50% of this time was spent with direct patient care discussing the diagnosis, management and prognosis of her care.  Review of Systems: A review of systems was performed and was negative except per HPI   Past Obstetrical History:  OB History  Gravida Para Term Preterm AB Living  4 3 3   3   SAB IAB Ectopic Multiple Live Births     0 3    # Outcome Date GA Lbr Len/2nd Weight Sex Type Anes PTL Lv  4 Current           3 Term 03/18/22 [redacted]w[redacted]d 05:50 / 00:05 7 lb 6.3 oz (3.354 kg) F Vag-Spont Local  LIV  Birth Comments: WDL  2 Term 10/25/19 [redacted]w[redacted]d 04:16 / 00:05 6 lb 12.6 oz (3.079 kg) F Vag-Spont None  LIV  1 Term 01/17/18 [redacted]w[redacted]d 07:47 / 00:17 6 lb 6.3 oz (2.9 kg) F Vag-Spont None  LIV     Past Medical History:  Past Medical History:  Diagnosis Date    Eczema    Medical history non-contributory    Normal labor 03/18/2022   Vaginal delivery 03/18/2022     Past Surgical History:    Past Surgical History:  Procedure Laterality Date   LASIK     NO PAST SURGERIES       Home Medications:   Current Outpatient Medications on File Prior to Visit  Medication Sig Dispense Refill   acetaminophen  (TYLENOL ) 325 MG tablet Take 2 tablets (650 mg total) by mouth every 4 (four) hours as needed (for pain scale < 4). (Patient not taking: Reported on 08/11/2024)     doxycycline  (VIBRAMYCIN ) 100 MG capsule Take 1 capsule (100 mg total) by mouth 2 (two) times daily. (Patient not taking: Reported on 08/11/2024) 20 capsule 0   ferrous gluconate  (FERGON) 324 MG tablet Take 1 tablet (324 mg total) by mouth every other day. (Patient not taking: Reported on 08/11/2024) 30 tablet 0   ibuprofen  (ADVIL ) 600 MG tablet Take 1 tablet (600 mg total) by mouth every 6 (six) hours as needed. (Patient not taking: Reported on 08/11/2024) 30 tablet 0   Prenatal Vit-Fe Fumarate-FA (PRENATAL MULTIVITAMIN) TABS tablet Take 1 tablet by mouth at bedtime.      No current facility-administered medications on file prior to visit.      Allergies:   No Known Allergies   Physical Exam:   There were no vitals filed for this visit. Sitting comfortably on the sonogram table Nonlabored breathing Normal rate and rhythm Abdomen is nontender  Thank you for the opportunity to be involved with this patient's care. Please let us  know if we can be of any further assistance.   Delora Smaller MFM, [place]   08/11/2024  11:01 AM

## 2024-08-15 ENCOUNTER — Other Ambulatory Visit

## 2024-08-15 ENCOUNTER — Ambulatory Visit

## 2024-08-15 ENCOUNTER — Telehealth

## 2024-08-15 DIAGNOSIS — Z148 Genetic carrier of other disease: Secondary | ICD-10-CM

## 2024-08-16 ENCOUNTER — Ambulatory Visit

## 2024-08-16 DIAGNOSIS — Z148 Genetic carrier of other disease: Secondary | ICD-10-CM | POA: Diagnosis not present

## 2024-08-16 NOTE — Progress Notes (Signed)
 [facility] at [facility] [address], [address] Phone:  [phone]   Fax:  [phone]      Virtual Visit via Video Note  I connected with  Michelle Bridges on 08/16/24 at  2:30 PM EST by a video enabled telemedicine application and verified that I am speaking with the correct person using two identifiers.  Location: Patient: Michelle Bridges. Provider: [facility].   I discussed the limitations of evaluation and management by telemedicine and the availability of in person appointments. The patient expressed understanding and agreed to proceed.  Genetic Counseling Clinic Note:   I spoke with 33 y.o. Michelle Bridges today to discuss her carrier screening results. She was referred by Michelle Netter, FNP.   Pregnancy History:    [id number]. EGA: [redacted]w[redacted]d by LMP. EDD: 01/04/2025. Michelle Bridges has three healthy children. Denies major personal health concerns. Denies bleeding, infections, and fevers in this pregnancy. Denies using tobacco, alcohol, or street drugs in this pregnancy.   Family History:    A three-generation pedigree was created and scanned into Epic under the Media tab.  Patient ethnicity reported as Hispanic and FOB ethnicity reported as Hispanic. Denies Ashkenazi Jewish ancestry.  Family history not remarkable for consanguinity, individuals with birth defects, intellectual disability, autism spectrum disorder, multiple spontaneous abortions, still births, or unexplained neonatal death.   Increased Risk to be a Silent Carrier for Spinal Muscular Atrophy (SMA):  Michelle Bridges was found to be at an increased risk to be a silent carrier for SMA through carrier screening. Michelle Bridges screened to have two functional copies of the SMN1 gene; however, due to limitations of genetic screening the laboratory cannot confirm if Michelle Bridges's two functional copies are on the same chromosome or on opposite  chromosomes. 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 Specifically, given Michelle Bridges's ethnic background, her risk to be a silent carrier is approximately 1 in 117 (~0.9%). Please see report for details.  We reviewed the natural history of SMA, the SMN1 genes, and the autosomal recessive mode of inheritance of the condition. We reviewed that if Michelle Bridges was a silent SMA carrier, she would either pass down the chromosome with both copies of the SMN1 gene OR the chromosome with the deletions. If Michelle Bridges 's reproductive partner is found to also be a carrier for SMA, there would be a 25% risk for the current pregnancy and all future pregnancies the couple has together to be affected (have zero functional copies of the SMN1 gene).  Given Michelle Bridges's carrier screening results, we discussed and offered carrier screening for her reproductive partner. FOB Michelle Bridges completed carrier screening during their previous pregnancy and he was not found to be a carrier for SMA. Therefore, this couple's pregnancy is at a very low (non-zero) risk to be affected with SMA. Please see test report for residual risk information. We reviewed the option of amniocentesis for prenatal diagnosis. The patient declined. We reviewed SMA is included in [name] 's Newborn Screening Program.  Of note, Michelle Bridges was not found to be a carrier for the other three conditions screened for (alpha thalassemia, beta-hemoglobinopathies, and CF). This significantly reduces but does not eliminate the chance of being a carrier. Please see report for residual risk information.   Newborn Screening. The   Newborn Screening (NBS) program will screen all newborn  babies for cystic fibrosis, spinal muscular atrophy, hemoglobinopathies, and numerous other conditions.  Previous Testing Completed:  Low risk NIPS:  Michelle Bridges previously completed [facility] noninvasive prenatal screening (NIPS) in this pregnancy. The result is low risk, consistent with a female fetus. This screening significantly reduces but does not eliminate the chance that the current pregnancy has Down syndrome (trisomy 58), trisomy 78, trisomy 21, common sex chromosome conditions, and 22q11.2 microdeletion syndrome. Please see report for residual risk information. There are many genetic conditions that cannot be detected by NIPS.   Negative ms-AFP screening: Michelle Bridges previously completed a maternal serum AFP screen in this pregnancy. The result is screen negative. Please see report for residual risk information. A negative result reduces the risk that the current pregnancy has an open neural tube defect. Closed neural tube defects and some open defects may not be detected by this screen.   Plan of Care:   Declined amniocentesis. Newborn screening. Routine prenatal care.   Informed consent was obtained. All questions were answered.   30 minutes were spent on the date of the encounter in service to the patient including preparation, consultation through video chat, discussion of test reports and available next steps, pedigree construction, genetic risk assessment, documentation, and care coordination.    Thank you for sharing in the care of Michelle Bridges with us .  Please do not hesitate to contact us  at [phone] if you have any questions.   Michelle Bodily, MS, [facility] Certified Genetic Counselor   Genetic counseling student involved in appointment: No.

## 2024-09-04 ENCOUNTER — Ambulatory Visit: Payer: Self-pay
